# Patient Record
Sex: Female | Born: 1990
Health system: Southern US, Community
[De-identification: ages and names within clinical notes are randomized; demographics above are authoritative.]

## PROBLEM LIST (undated history)

## (undated) DIAGNOSIS — F419 Anxiety disorder, unspecified: Secondary | ICD-10-CM

## (undated) DIAGNOSIS — D649 Anemia, unspecified: Secondary | ICD-10-CM

## (undated) DIAGNOSIS — F32A Depression, unspecified: Secondary | ICD-10-CM

## (undated) DIAGNOSIS — N83209 Unspecified ovarian cyst, unspecified side: Secondary | ICD-10-CM

## (undated) DIAGNOSIS — N809 Endometriosis, unspecified: Secondary | ICD-10-CM

## (undated) DIAGNOSIS — F329 Major depressive disorder, single episode, unspecified: Secondary | ICD-10-CM

## (undated) HISTORY — DX: Depression, unspecified: F32.A

## (undated) HISTORY — DX: Anxiety disorder, unspecified: F41.9

## (undated) HISTORY — DX: Major depressive disorder, single episode, unspecified: F32.9

## (undated) HISTORY — PX: OTHER SURGICAL HISTORY: SHX169

## (undated) HISTORY — DX: Endometriosis, unspecified: N80.9

## (undated) HISTORY — DX: Anemia, unspecified: D64.9

---
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
## 2014-04-19 ENCOUNTER — Emergency Department: Payer: Self-pay | Admitting: Emergency Medicine

## 2014-04-19 LAB — URINALYSIS, COMPLETE
Bilirubin,UR: NEGATIVE
GLUCOSE, UR: NEGATIVE mg/dL (ref 0–75)
Ketone: NEGATIVE
Nitrite: NEGATIVE
PROTEIN: NEGATIVE
Ph: 5 (ref 4.5–8.0)
RBC,UR: 1 /HPF (ref 0–5)
SPECIFIC GRAVITY: 1.012 (ref 1.003–1.030)
Squamous Epithelial: 2
WBC UR: 3 /HPF (ref 0–5)

## 2014-04-19 LAB — CBC WITH DIFFERENTIAL/PLATELET
BASOS PCT: 0.6 %
Basophil #: 0.1 10*3/uL (ref 0.0–0.1)
EOS ABS: 0.1 10*3/uL (ref 0.0–0.7)
Eosinophil %: 0.5 %
HCT: 43.8 % (ref 35.0–47.0)
HGB: 14.4 g/dL (ref 12.0–16.0)
Lymphocyte #: 3.2 10*3/uL (ref 1.0–3.6)
Lymphocyte %: 27.1 %
MCH: 29.2 pg (ref 26.0–34.0)
MCHC: 32.9 g/dL (ref 32.0–36.0)
MCV: 89 fL (ref 80–100)
Monocyte #: 0.6 x10 3/mm (ref 0.2–0.9)
Monocyte %: 5.2 %
Neutrophil #: 7.8 10*3/uL — ABNORMAL HIGH (ref 1.4–6.5)
Neutrophil %: 66.6 %
Platelet: 315 10*3/uL (ref 150–440)
RBC: 4.93 10*6/uL (ref 3.80–5.20)
RDW: 13.1 % (ref 11.5–14.5)
WBC: 11.7 10*3/uL — AB (ref 3.6–11.0)

## 2014-04-19 LAB — COMPREHENSIVE METABOLIC PANEL
ALK PHOS: 107 U/L
ALT: 20 U/L (ref 12–78)
ANION GAP: 9 (ref 7–16)
Albumin: 4.3 g/dL (ref 3.4–5.0)
BILIRUBIN TOTAL: 0.5 mg/dL (ref 0.2–1.0)
BUN: 10 mg/dL (ref 7–18)
Calcium, Total: 9 mg/dL (ref 8.5–10.1)
Chloride: 105 mmol/L (ref 98–107)
Co2: 24 mmol/L (ref 21–32)
Creatinine: 0.89 mg/dL (ref 0.60–1.30)
EGFR (African American): >60
EGFR (Non-African Amer.): >60
GLUCOSE: 87 mg/dL (ref 65–99)
Osmolality: 274 (ref 275–301)
POTASSIUM: 3.8 mmol/L (ref 3.5–5.1)
SGOT(AST): 23 U/L (ref 15–37)
SODIUM: 138 mmol/L (ref 136–145)
TOTAL PROTEIN: 8.6 g/dL — AB (ref 6.4–8.2)

## 2014-04-19 LAB — LIPASE, BLOOD: LIPASE: 86 U/L (ref 73–393)

## 2014-04-30 ENCOUNTER — Emergency Department: Payer: Self-pay | Admitting: Emergency Medicine

## 2014-04-30 LAB — COMPREHENSIVE METABOLIC PANEL
ALK PHOS: 104 U/L
Albumin: 4.2 g/dL (ref 3.4–5.0)
Anion Gap: 5 — ABNORMAL LOW (ref 7–16)
BUN: 12 mg/dL (ref 7–18)
Bilirubin,Total: 0.7 mg/dL (ref 0.2–1.0)
CALCIUM: 9.3 mg/dL (ref 8.5–10.1)
CO2: 28 mmol/L (ref 21–32)
Chloride: 104 mmol/L (ref 98–107)
Creatinine: 1 mg/dL (ref 0.60–1.30)
Glucose: 95 mg/dL (ref 65–99)
Osmolality: 273 (ref 275–301)
Potassium: 3.6 mmol/L (ref 3.5–5.1)
SGOT(AST): 12 U/L — ABNORMAL LOW (ref 15–37)
SGPT (ALT): 14 U/L (ref 12–78)
SODIUM: 137 mmol/L (ref 136–145)
Total Protein: 8.5 g/dL — ABNORMAL HIGH (ref 6.4–8.2)

## 2014-04-30 LAB — CBC WITH DIFFERENTIAL/PLATELET
Basophil #: 0 10*3/uL (ref 0.0–0.1)
Basophil %: 0.6 %
EOS PCT: 0.2 %
Eosinophil #: 0 10*3/uL (ref 0.0–0.7)
HCT: 42 % (ref 35.0–47.0)
HGB: 13.8 g/dL (ref 12.0–16.0)
LYMPHS ABS: 1.4 10*3/uL (ref 1.0–3.6)
LYMPHS PCT: 21.2 %
MCH: 29.2 pg (ref 26.0–34.0)
MCHC: 32.9 g/dL (ref 32.0–36.0)
MCV: 89 fL (ref 80–100)
MONO ABS: 0.5 x10 3/mm (ref 0.2–0.9)
Monocyte %: 7.3 %
NEUTROS ABS: 4.6 10*3/uL (ref 1.4–6.5)
NEUTROS PCT: 70.7 %
PLATELETS: 318 10*3/uL (ref 150–440)
RBC: 4.73 10*6/uL (ref 3.80–5.20)
RDW: 12.9 % (ref 11.5–14.5)
WBC: 6.5 10*3/uL (ref 3.6–11.0)

## 2014-04-30 LAB — URINALYSIS, COMPLETE
Bilirubin,UR: NEGATIVE
GLUCOSE, UR: NEGATIVE mg/dL (ref 0–75)
Ketone: NEGATIVE
Leukocyte Esterase: NEGATIVE
NITRITE: NEGATIVE
PH: 6 (ref 4.5–8.0)
PROTEIN: NEGATIVE
Specific Gravity: 1.004 (ref 1.003–1.030)
Squamous Epithelial: 1
WBC UR: <1 /HPF (ref 0–5)

## 2014-12-11 ENCOUNTER — Emergency Department (HOSPITAL_COMMUNITY)
Admission: EM | Admit: 2014-12-11 | Discharge: 2014-12-12 | Disposition: A | Discharge status: Home / Self Care | Payer: BC Managed Care – PPO | Attending: Emergency Medicine | Admitting: Emergency Medicine

## 2014-12-11 ENCOUNTER — Emergency Department (HOSPITAL_COMMUNITY): Payer: BC Managed Care – PPO

## 2014-12-11 ENCOUNTER — Encounter (HOSPITAL_COMMUNITY): Payer: Self-pay | Admitting: Emergency Medicine

## 2014-12-11 DIAGNOSIS — R102 Pelvic and perineal pain: Secondary | ICD-10-CM | POA: Diagnosis not present

## 2014-12-11 DIAGNOSIS — Z3202 Encounter for pregnancy test, result negative: Secondary | ICD-10-CM | POA: Diagnosis not present

## 2014-12-11 DIAGNOSIS — Z8742 Personal history of other diseases of the female genital tract: Secondary | ICD-10-CM | POA: Insufficient documentation

## 2014-12-11 DIAGNOSIS — R1031 Right lower quadrant pain: Secondary | ICD-10-CM | POA: Diagnosis present

## 2014-12-11 HISTORY — DX: Unspecified ovarian cyst, unspecified side: N83.209

## 2014-12-11 LAB — CBC WITH DIFFERENTIAL/PLATELET
BASOS PCT: 0 % (ref 0–1)
Basophils Absolute: 0 10*3/uL (ref 0.0–0.1)
Eosinophils Absolute: 0 10*3/uL (ref 0.0–0.7)
Eosinophils Relative: 0 % (ref 0–5)
HEMATOCRIT: 39.2 % (ref 36.0–46.0)
Hemoglobin: 12.8 g/dL (ref 12.0–15.0)
Lymphocytes Relative: 23 % (ref 12–46)
Lymphs Abs: 2 10*3/uL (ref 0.7–4.0)
MCH: 28.3 pg (ref 26.0–34.0)
MCHC: 32.7 g/dL (ref 30.0–36.0)
MCV: 86.7 fL (ref 78.0–100.0)
MONO ABS: 0.6 10*3/uL (ref 0.1–1.0)
MONOS PCT: 7 % (ref 3–12)
Neutro Abs: 6.2 10*3/uL (ref 1.7–7.7)
Neutrophils Relative %: 70 % (ref 43–77)
Platelets: 370 10*3/uL (ref 150–400)
RBC: 4.52 MIL/uL (ref 3.87–5.11)
RDW: 12.8 % (ref 11.5–15.5)
WBC: 8.9 10*3/uL (ref 4.0–10.5)

## 2014-12-11 LAB — COMPREHENSIVE METABOLIC PANEL
ALBUMIN: 4.2 g/dL (ref 3.5–5.2)
ALK PHOS: 96 U/L (ref 39–117)
ALT: 10 U/L (ref 0–35)
AST: 14 U/L (ref 0–37)
Anion gap: 16 — ABNORMAL HIGH (ref 5–15)
BILIRUBIN TOTAL: 0.3 mg/dL (ref 0.3–1.2)
BUN: 8 mg/dL (ref 6–23)
CO2: 23 meq/L (ref 19–32)
CREATININE: 0.87 mg/dL (ref 0.50–1.10)
Calcium: 9.3 mg/dL (ref 8.4–10.5)
Chloride: 101 mEq/L (ref 96–112)
GFR calc non Af Amer: >90 mL/min (ref >90)
GLUCOSE: 94 mg/dL (ref 70–99)
POTASSIUM: 3.7 meq/L (ref 3.7–5.3)
Sodium: 140 mEq/L (ref 137–147)
Total Protein: 7.7 g/dL (ref 6.0–8.3)

## 2014-12-11 LAB — URINALYSIS, ROUTINE W REFLEX MICROSCOPIC
BILIRUBIN URINE: NEGATIVE
Glucose, UA: NEGATIVE mg/dL
HGB URINE DIPSTICK: NEGATIVE
KETONES UR: 15 mg/dL — AB
Leukocytes, UA: NEGATIVE
Nitrite: NEGATIVE
Protein, ur: NEGATIVE mg/dL
SPECIFIC GRAVITY, URINE: 1.019 (ref 1.005–1.030)
Urobilinogen, UA: 0.2 mg/dL (ref 0.0–1.0)
pH: 5.5 (ref 5.0–8.0)

## 2014-12-11 LAB — PREGNANCY, URINE: PREG TEST UR: NEGATIVE

## 2014-12-11 NOTE — ED Notes (Signed)
Patient travelling to Ultrasound with EMT Venus as chaperone.

## 2014-12-11 NOTE — ED Provider Notes (Signed)
CSN: [id number]     Arrival date & time 12/11/14  1950 History   First MD Initiated Contact with Patient 12/11/14 2109     Chief Complaint  Patient presents with  . Abdominal Pain     (Consider location/radiation/quality/duration/timing/severity/associated sxs/prior Treatment) HPI The patient reports she developed right lower quadrant pain yesterday evening. She was is painful at onset but has become somewhat more painful over the course of the day. It has been waxing and waning in severity. The patient reports she has not tried any ibuprofen because there is not much pain at certain times and then at other times it gets very painful. She reports it has not gotten any worse since about 4 PM. She reports it is a very local pain, she can put her finger on the one spot in her right lower quadrant. She reports she's had an ovarian cyst in the past however the same time she was having some "intestinal" problems. It was hard for her to differentiate which was causing the problem at that time. That was approximately 8 months ago. She reports she has some brown vaginal spotting about 2 weeks ago. She denies any abnormal vaginal discharge since. The patient is sexually active. There's been no fever. No vomiting or no diarrhea. She denies any pain burning or urgency to urination. Past Medical History  Diagnosis Date  . Ovarian cyst    History reviewed. No pertinent past surgical history. No family history on file. History  Substance Use Topics  . Smoking status: Never Smoker   . Smokeless tobacco: Not on file  . Alcohol Use: Yes   OB History    No data available     Review of Systems 10 Systems reviewed and are negative for acute change except as noted in the HPI.    Allergies  Review of patient's allergies indicates no known allergies.  Home Medications   Prior to Admission medications   Medication Sig Start Date End Date Taking? Authorizing Provider  ibuprofen (ADVIL,MOTRIN) 800 MG  tablet Take 1 tablet (800 mg total) by mouth 3 (three) times daily. 12/12/14   Arby BarretteMarcy Saheed Carrington, MD   BP 107/53 mmHg  Pulse 88  Temp(Src) 98 F (36.7 C) (Oral)  Resp 18  Ht 5\' 1"  (1.549 m)  Wt 165 lb (74.844 kg)  BMI 31.19 kg/m2  SpO2 99% Physical Exam  Constitutional: She is oriented to person, place, and time. She appears well-developed and well-nourished.  HENT:  Head: Normocephalic and atraumatic.  Eyes: EOM are normal. Pupils are equal, round, and reactive to light.  Neck: Neck supple.  Cardiovascular: Normal rate, regular rhythm, normal heart sounds and intact distal pulses.   Pulmonary/Chest: Effort normal and breath sounds normal.  Abdominal: Soft. Bowel sounds are normal. She exhibits no distension. There is no tenderness (The patient has focal right lower quadrant pain. Very local to the adnexa. No guarding or rebound present.).  Genitourinary: Vagina normal.  Speculum examination shows normal-appearing cervix without drainage or discharge. No friability. Bimanual examination no cervical motion tenderness. Pain localizes to the right adnexa. Left adnexa is nontender.  Musculoskeletal: Normal range of motion. She exhibits no edema.  Neurological: She is alert and oriented to person, place, and time. She has normal strength. Coordination normal. GCS eye subscore is 4. GCS verbal subscore is 5. GCS motor subscore is 6.  Skin: Skin is warm, dry and intact.  Psychiatric: She has a normal mood and affect.    ED Course  Procedures (including  critical care time) Labs Review Labs Reviewed  COMPREHENSIVE METABOLIC PANEL - Abnormal; Notable for the following:    Anion gap 16 (*)    All other components within normal limits  URINALYSIS, ROUTINE W REFLEX MICROSCOPIC - Abnormal; Notable for the following:    APPearance CLOUDY (*)    Ketones, ur 15 (*)    All other components within normal limits  WET PREP, GENITAL  GC/CHLAMYDIA PROBE AMP  CBC WITH DIFFERENTIAL  PREGNANCY, URINE     Imaging Review US Transvaginal Non-ob  12/11/2014   CLINICAL DATA:  Acute onset of pelvic pain.  Initial encounter.  EXAM: TRANSABDOMINAL AND TRANSVAGINAL ULTRASOUND OF PELVIS  DOPPLER ULTRASOUND OF OVARIES  TECHNIQUE: Both transabdominal and transvaginal ultrasound examinations of the pelvis were performed. Transabdominal technique was performed for global imaging of the pelvis including uterus, ovaries, adnexal regions, and pelvic cul-de-sac.  It was necessary to proceed with endovaginal exam following the transabdominal exam to visualize the uterus and ovaries in greater detail. Color and duplex Doppler ultrasound was utilized to evaluate blood flow to the ovaries.  COMPARISON:  None.  FINDINGS: Uterus  Measurements: 8.2 x 3.1 x 4.1 cm. No fibroids or other mass visualized. A nabothian cyst is noted at the cervix.  Endometrium  Thickness: 0.3 cm.  No focal abnormality visualized.  Right ovary  Measurements: 3.9 x 2.0 x 1.9 cm. Normal appearance/no adnexal mass.  Left ovary  Measurements: 3.3 x 1.8 x 2.4 cm. Normal appearance/no adnexal mass.  Pulsed Doppler evaluation of both ovaries demonstrates normal low-resistance arterial and venous waveforms.  Other findings  A small amount of free fluid is seen in the pelvic cul-de-sac.  IMPRESSION: Unremarkable pelvic ultrasound.  No evidence for ovarian torsion.   Electronically Signed   By: Roanna Raider M.D.   On: 12/11/2014 23:58   US Pelvis Complete  12/11/2014   CLINICAL DATA:  Acute onset of pelvic pain.  Initial encounter.  EXAM: TRANSABDOMINAL AND TRANSVAGINAL ULTRASOUND OF PELVIS  DOPPLER ULTRASOUND OF OVARIES  TECHNIQUE: Both transabdominal and transvaginal ultrasound examinations of the pelvis were performed. Transabdominal technique was performed for global imaging of the pelvis including uterus, ovaries, adnexal regions, and pelvic cul-de-sac.  It was necessary to proceed with endovaginal exam following the transabdominal exam to visualize the  uterus and ovaries in greater detail. Color and duplex Doppler ultrasound was utilized to evaluate blood flow to the ovaries.  COMPARISON:  None.  FINDINGS: Uterus  Measurements: 8.2 x 3.1 x 4.1 cm. No fibroids or other mass visualized. A nabothian cyst is noted at the cervix.  Endometrium  Thickness: 0.3 cm.  No focal abnormality visualized.  Right ovary  Measurements: 3.9 x 2.0 x 1.9 cm. Normal appearance/no adnexal mass.  Left ovary  Measurements: 3.3 x 1.8 x 2.4 cm. Normal appearance/no adnexal mass.  Pulsed Doppler evaluation of both ovaries demonstrates normal low-resistance arterial and venous waveforms.  Other findings  A small amount of free fluid is seen in the pelvic cul-de-sac.  IMPRESSION: Unremarkable pelvic ultrasound.  No evidence for ovarian torsion.   Electronically Signed   By: Roanna Raider M.D.   On: 12/11/2014 23:58   [place] Art/ven Flow Abd Pelv Doppler  12/11/2014   CLINICAL DATA:  Acute onset of pelvic pain.  Initial encounter.  EXAM: TRANSABDOMINAL AND TRANSVAGINAL ULTRASOUND OF PELVIS  DOPPLER ULTRASOUND OF OVARIES  TECHNIQUE: Both transabdominal and transvaginal ultrasound examinations of the pelvis were performed. Transabdominal technique was performed for global imaging of the pelvis  including uterus, ovaries, adnexal regions, and pelvic cul-de-sac.  It was necessary to proceed with endovaginal exam following the transabdominal exam to visualize the uterus and ovaries in greater detail. Color and duplex Doppler ultrasound was utilized to evaluate blood flow to the ovaries.  COMPARISON:  None.  FINDINGS: Uterus  Measurements: 8.2 x 3.1 x 4.1 cm. No fibroids or other mass visualized. A nabothian cyst is noted at the cervix.  Endometrium  Thickness: 0.3 cm.  No focal abnormality visualized.  Right ovary  Measurements: 3.9 x 2.0 x 1.9 cm. Normal appearance/no adnexal mass.  Left ovary  Measurements: 3.3 x 1.8 x 2.4 cm. Normal appearance/no adnexal mass.  Pulsed Doppler evaluation of both  ovaries demonstrates normal low-resistance arterial and venous waveforms.  Other findings  A small amount of free fluid is seen in the pelvic cul-de-sac.  IMPRESSION: Unremarkable pelvic ultrasound.  No evidence for ovarian torsion.   Electronically Signed   By: Roanna RaiderJeffery  Chang M.D.   On: 12/11/2014 23:58     EKG Interpretation None      MDM   Final diagnoses:  Pelvic pain in female   Torsion is ruled out. The patient's pain on pelvic examination localizes to the adnexa. There is no acute evidence of cervicitis or PID. This appears to be of lower probability to be appendicitis. The pain has been waxing and waning in severity and has not been worsening over the past 6 hours. The patient does not have leukocytosis or guarding. At this time she is given precautionary instructions for abdominal pain.    Arby BarretteMarcy Kimberlin Scheel, MD 12/12/14 [phone]

## 2014-12-11 NOTE — ED Notes (Signed)
Pt. reports right abdominal pain with nausea and vomitting onset yesterday ,no diarrhea , denies fever or chills.

## 2014-12-12 LAB — WET PREP, GENITAL
Clue Cells Wet Prep HPF POC: NONE SEEN
TRICH WET PREP: NONE SEEN
Yeast Wet Prep HPF POC: NONE SEEN

## 2014-12-12 MED ORDER — IBUPROFEN 800 MG PO TABS
800.0000 mg | ORAL_TABLET | Freq: Once | ORAL | Status: AC
  Administered 2014-12-12: 800 mg via ORAL
  Filled 2014-12-12: qty 1

## 2014-12-12 MED ORDER — IBUPROFEN 800 MG PO TABS: 800.0000 mg | ORAL_TABLET | Freq: Three times a day (TID) | ORAL | Status: DC

## 2014-12-12 NOTE — Discharge Instructions (Signed)
Abdominal Pain, Women °Abdominal (stomach, pelvic, or belly) pain can be caused by many things. It is important to tell your doctor: °· The location of the pain. °· Does it come and go or is it present all the time? °· Are there things that start the pain (eating certain foods, exercise)? °· Are there other symptoms associated with the pain (fever, nausea, vomiting, diarrhea)? °All of this is helpful to know when trying to find the cause of the pain. °CAUSES  °· Stomach: virus or bacteria infection, or ulcer. °· Intestine: appendicitis (inflamed appendix), regional ileitis (Crohn's disease), ulcerative colitis (inflamed colon), irritable bowel syndrome, diverticulitis (inflamed diverticulum of the colon), or cancer of the stomach or intestine. °· Gallbladder disease or stones in the gallbladder. °· Kidney disease, kidney stones, or infection. °· Pancreas infection or cancer. °· Fibromyalgia (pain disorder). °· Diseases of the female organs: °¨ Uterus: fibroid (non-cancerous) tumors or infection. °¨ Fallopian tubes: infection or tubal pregnancy. °¨ Ovary: cysts or tumors. °¨ Pelvic adhesions (scar tissue). °¨ Endometriosis (uterus lining tissue growing in the pelvis and on the pelvic organs). °¨ Pelvic congestion syndrome (female organs filling up with blood just before the menstrual period). °¨ Pain with the menstrual period. °¨ Pain with ovulation (producing an egg). °¨ Pain with an IUD (intrauterine device, birth control) in the uterus. °¨ Cancer of the female organs. °· Functional pain (pain not caused by a disease, may improve without treatment). °· Psychological pain. °· Depression. °DIAGNOSIS  °Your doctor will decide the seriousness of your pain by doing an examination. °· Blood tests. °· X-rays. °· Ultrasound. °· CT scan (computed tomography, special type of X-ray). °· MRI (magnetic resonance imaging). °· Cultures, for infection. °· Barium enema (dye inserted in the large intestine, to better view it with  X-rays). °· Colonoscopy (looking in intestine with a lighted tube). °· Laparoscopy (minor surgery, looking in abdomen with a lighted tube). °· Major abdominal exploratory surgery (looking in abdomen with a large incision). °TREATMENT  °The treatment will depend on the cause of the pain.  °· Many cases can be observed and treated at home. °· Over-the-counter medicines recommended by your caregiver. °· Prescription medicine. °· Antibiotics, for infection. °· Birth control pills, for painful periods or for ovulation pain. °· Hormone treatment, for endometriosis. °· Nerve blocking injections. °· Physical therapy. °· Antidepressants. °· Counseling with a psychologist or psychiatrist. °· Minor or major surgery. °HOME CARE INSTRUCTIONS  °· Do not take laxatives, unless directed by your caregiver. °· Take over-the-counter pain medicine only if ordered by your caregiver. Do not take aspirin because it can cause an upset stomach or bleeding. °· Try a clear liquid diet (broth or water) as ordered by your caregiver. Slowly move to a bland diet, as tolerated, if the pain is related to the stomach or intestine. °· Have a thermometer and take your temperature several times a day, and record it. °· Bed rest and sleep, if it helps the pain. °· Avoid sexual intercourse, if it causes pain. °· Avoid stressful situations. °· Keep your follow-up appointments and tests, as your caregiver orders. °· If the pain does not go away with medicine or surgery, you may try: °¨ Acupuncture. °¨ Relaxation exercises (yoga, meditation). °¨ Group therapy. °¨ Counseling. °SEEK MEDICAL CARE IF:  °· You notice certain foods cause stomach pain. °· Your home care treatment is not helping your pain. °· You need stronger pain medicine. °· You want your IUD removed. °· You feel faint or   lightheaded. °· You develop nausea and vomiting. °· You develop a rash. °· You are having side effects or an allergy to your medicine. °SEEK IMMEDIATE MEDICAL CARE IF:  °· Your  pain does not go away or gets worse. °· You have a fever. °· Your pain is felt only in portions of the abdomen. The right side could possibly be appendicitis. The left lower portion of the abdomen could be colitis or diverticulitis. °· You are passing blood in your stools (bright red or black tarry stools, with or without vomiting). °· You have blood in your urine. °· You develop chills, with or without a fever. °· You pass out. °MAKE SURE YOU:  °· Understand these instructions. °· Will watch your condition. °· Will get help right away if you are not doing well or get worse. °Document Released: 10/08/2007 Document Revised: 04/27/2014 Document Reviewed: 10/28/2009 °[employer]® Patient Information ©2015 [employer]. This information is not intended to replace advice given to you by your health care provider. Make sure you discuss any questions you have with your health care provider. ° °

## 2014-12-14 LAB — GC/CHLAMYDIA PROBE AMP
CT PROBE, AMP APTIMA: NEGATIVE
GC Probe RNA: NEGATIVE

## 2015-01-11 ENCOUNTER — Ambulatory Visit: Payer: Self-pay | Admitting: Obstetrics & Gynecology

## 2015-01-11 LAB — CBC
HCT: 41.9 % (ref 35.0–47.0)
HGB: 13.2 g/dL (ref 12.0–16.0)
MCH: 28.5 pg (ref 26.0–34.0)
MCHC: 31.4 g/dL — AB (ref 32.0–36.0)
MCV: 91 fL (ref 80–100)
Platelet: 324 10*3/uL (ref 150–440)
RBC: 4.61 10*6/uL (ref 3.80–5.20)
RDW: 13.9 % (ref 11.5–14.5)
WBC: 8.2 10*3/uL (ref 3.6–11.0)

## 2015-01-19 ENCOUNTER — Ambulatory Visit: Payer: Self-pay | Admitting: Obstetrics & Gynecology

## 2015-04-19 LAB — SURGICAL PATHOLOGY

## 2015-04-25 NOTE — Op Note (Signed)
PATIENT NAME:  Tonya Hill, Tonya Hill MR#:  [id number] DATE OF BIRTH:  01-04-1991  DATE OF PROCEDURE:  01/19/2015  PREOPERATIVE DIAGNOSIS: Chronic pelvic pain.   POSTOPERATIVE DIAGNOSIS: Chronic pelvic pain.   PROCEDURE: Diagnostic laparoscopy, biopsy of peritoneum.   SURGEON: Dierdre Searles. Paul Leslea Vowles, MD   ANESTHESIA: General.   ESTIMATED BLOOD LOSS: Minimal.   COMPLICATIONS: None.   FINDINGS: Normal appendix, liver, bowel, colon, ovaries, tubes, and uterus. There is a focus of possible endometriosis in the posterior cul-de-sac on the left side.   DISPOSITION: To the recovery room in stable condition.   TECHNIQUE: The patient is prepped and draped in the usual sterile fashion after adequate anesthesia is obtained in the dorsal lithotomy position. The bladder is drained with a Robinson catheter. A speculum is placed, the cervix is identified, and a Hulka tenaculum is placed.   Attention is then turned to the abdomen, where a Veress needle is inserted through a 5 mm infraumbilical incision after Marcaine is used to anesthetize the skin. Veress needle placement is confirmed using the hanging drop technique and the abdomen is then insufflated with CO2 gas. A 5 mm trocar is then inserted under direct visualization with the laparoscope, with no injuries or bleeding noted. The patient is placed in Trendelenburg positioning and the above-mentioned findings were visualized.   A 5 mm trocar is placed in the right lower quadrant, lateral to the inferior epigastric blood vessels, with no injuries or bleeding noted. This allowed further instrumentation for a complete visualization of the abdominal and pelvic organs. The peritoneum posterior to the left uterosacral ligament is biopsied. The tissue was minimal, and there is no bleeding at the biopsy site. There is some free fluid in the pelvic cul-de-sac, and this is aspirated. The patient is leveled. No bleeding is noted. No complications were visualized. Gas is  expelled. Trocars are removed, and skin is closed with Dermabond. The patient goes to the recovery room in stable condition after the Hulka tenaculum was removed. All sponge, instrument, and needle counts were correct.     ____________________________ R. Annamarie MajorPaul Marlin Brys, MD rph:mw D: 01/19/2015 13:16:14 ET T: 01/19/2015 14:43:21 ET JOB#: [id number]  cc: Dierdre Searles. Paul Zyrus Hetland, MD, <Dictator> Nadara MustardOBERT P Lorilyn Laitinen MD ELECTRONICALLY SIGNED 01/19/2015 15:56

## 2015-08-05 ENCOUNTER — Ambulatory Visit: Payer: BLUE CROSS/BLUE SHIELD | Admitting: Podiatry

## 2015-09-08 ENCOUNTER — Encounter: Payer: Self-pay | Admitting: Obstetrics and Gynecology

## 2015-09-08 ENCOUNTER — Ambulatory Visit (INDEPENDENT_AMBULATORY_CARE_PROVIDER_SITE_OTHER): Payer: BLUE CROSS/BLUE SHIELD | Admitting: Obstetrics and Gynecology

## 2015-09-08 VITALS — BP 122/70 | HR 84 | Resp 14 | Ht 61.5 in | Wt 179.0 lb

## 2015-09-08 DIAGNOSIS — Z124 Encounter for screening for malignant neoplasm of cervix: Secondary | ICD-10-CM

## 2015-09-08 DIAGNOSIS — Z01419 Encounter for gynecological examination (general) (routine) without abnormal findings: Secondary | ICD-10-CM

## 2015-09-08 DIAGNOSIS — Z3009 Encounter for other general counseling and advice on contraception: Secondary | ICD-10-CM

## 2015-09-08 DIAGNOSIS — N946 Dysmenorrhea, unspecified: Secondary | ICD-10-CM | POA: Diagnosis not present

## 2015-09-08 DIAGNOSIS — Z113 Encounter for screening for infections with a predominantly sexual mode of transmission: Secondary | ICD-10-CM

## 2015-09-08 MED ORDER — NORETHIN ACE-ETH ESTRAD-FE 1-20 MG-MCG PO TABS: 1.0000 | ORAL_TABLET | Freq: Every day | ORAL | Status: DC

## 2015-09-08 MED ORDER — NAPROXEN SODIUM 550 MG PO TABS: 550.0000 mg | ORAL_TABLET | Freq: Two times a day (BID) | ORAL | Status: DC

## 2015-09-08 NOTE — Progress Notes (Signed)
Patient ID: Tonya Hill, female   DOB: 1991/12/20, 24 y.o.   MRN: [id number] 24 y.o. G1P0100 SingleCaucasianF here for annual exam.  The patient has a nexplanon for almost 2 years. She went on it for cycle control, initially she didn't bleed. Since January she has monthly heavy, crampy cycles. Prior to the nexplanon her cycles were q 3-4 weeks for 1-2 weeks. Currently she saturates a super tampon in up to 2 hours. Cramps are bad, but still better than prior to the nexplanon. She has gained 30 lbs on the Nexplanon. Sexually active, same partner x 2 years. No dyspareunia.   When she was on ON777 she developed migraines, no auras, no migraines since going off of OCP's.   Patient wants to discuss having her Nexplanon removed  Period Cycle (Days): 28 Period Duration (Days): 5 days  Period Pattern: Regular Menstrual Flow: Heavy Menstrual Control: Tampon Dysmenorrhea: (!) Moderate Dysmenorrhea Symptoms: Cramping  Patient's last menstrual period was 08/21/2015.          Sexually active: Yes.    The current method of family planning is Nexplanon    Exercising: Yes.    running/ cardio Smoker:  no  Health Maintenance: Pap:  Never History of abnormal Pap:  N/A MMG:  N/A Colonoscopy:  N/A BMD:   N/A TDaP:  2012 Gardasil: completed all 3 per patient    reports that she has never smoked. She has never used smokeless tobacco. She reports that she drinks about 3.0 oz of alcohol per week. She reports that she does not use illicit drugs.  Past Medical History  Diagnosis Date  . Ovarian cyst   . Anemia   . Anxiety   . Depression   . Endometriosis   She is seeing a therapist. Has an appointment to discuss medication with her primary.   History reviewed. No pertinent past surgical history.  Current Outpatient Prescriptions  Medication Sig Dispense Refill  . etonogestrel (NEXPLANON) 68 MG IMPL implant 1 each by Subdermal route once.     No current facility-administered medications for this  visit.    Family History  Problem Relation Age of Onset  . Hypertension Sister   . Diabetes Paternal Grandfather   . Hypertension Paternal Grandfather     Review of Systems  Constitutional: Negative.   HENT: Negative.   Eyes: Negative.   Respiratory: Negative.   Cardiovascular: Negative.   Gastrointestinal: Negative.   Endocrine: Negative.   Genitourinary: Negative.   Musculoskeletal: Negative.   Skin: Negative.   Allergic/Immunologic: Negative.   Neurological: Negative.   Psychiatric/Behavioral: Negative.     Exam:   BP 122/70 mmHg  Pulse 84  Resp 14  Ht 5' 1.5" (1.562 m)  Wt 179 lb (81.194 kg)  BMI 33.28 kg/m2  LMP 08/21/2015  Weight change: @WEIGHTCHANGE @ Height:   Height: 5' 1.5" (156.2 cm)  Ht Readings from Last 3 Encounters:  09/08/15 5' 1.5" (1.562 m)  12/11/14 5\' 1"  (1.549 m)    General appearance: alert, cooperative and appears stated age Head: Normocephalic, without obvious abnormality, atraumatic Neck: no adenopathy, supple, symmetrical, trachea midline and thyroid normal to inspection and palpation Lungs: clear to auscultation bilaterally Breasts: normal appearance, no masses or tenderness Heart: regular rate and rhythm Abdomen: soft, non-tender; bowel sounds normal; no masses,  no organomegaly Extremities: extremities normal, atraumatic, no cyanosis or edema Skin: Skin color, texture, turgor normal. No rashes or lesions Lymph nodes: Cervical, supraclavicular, and axillary nodes normal. No abnormal inguinal nodes palpated Neurologic: Grossly  normal   Pelvic: External genitalia:  no lesions              Urethra:  normal appearing urethra with no masses, tenderness or lesions              Bartholins and Skenes: normal                 Vagina: normal appearing vagina with normal color and discharge, no lesions              Cervix: no lesions               Bimanual Exam:  Uterus:  normal size, contour, position, consistency, mobility, non-tender and  anteverted              Adnexa: no mass, fullness, tenderness               Rectovaginal: Confirms               Anus:  normal sphincter tone, no lesions  Chaperone was present for exam.  A:  Well Woman with normal exam  Contraception management, not happy with the Nexplanon. H/o heavy painful cycles  Dysmenorrhea  Screening for STD   P:   Return for Nexplanon removal  Reviewed other options for contraception. Interested in OCP's, no contraindications, risks reviewed. If her migraines return she may need to stop the pills  Anaprox for cramps  Pap  STD testing, declines blood work  She will schedule the nexplanon removal when she start the pill on the first day of her cycle  Will then have her return for a 3 month pill check

## 2015-09-08 NOTE — Patient Instructions (Signed)
Oral Contraception Information Oral contraceptive pills (OCPs) are medicines taken to prevent pregnancy. OCPs work by preventing the ovaries from releasing eggs. The hormones in OCPs also cause the cervical mucus to thicken, preventing the sperm from entering the uterus. The hormones also cause the uterine lining to become thin, not allowing a fertilized egg to attach to the inside of the uterus. OCPs are highly effective when taken exactly as prescribed. However, OCPs do not prevent sexually transmitted diseases (STDs). Safe sex practices, such as using condoms along with the pill, can help prevent STDs.  Before taking the pill, you may have a physical exam and Pap test. Your health care provider may order blood tests. The health care provider will make sure you are a good candidate for oral contraception. Discuss with your health care provider the possible side effects of the OCP you may be prescribed. When starting an OCP, it can take 2 to 3 months for the body to adjust to the changes in hormone levels in your body.  TYPES OF ORAL CONTRACEPTION  The combination pill--This pill contains estrogen and progestin (synthetic progesterone) hormones. The combination pill comes in 21-day, 28-day, or 91-day packs. Some types of combination pills are meant to be taken continuously (365-day pills). With 21-day packs, you do not take pills for 7 days after the last pill. With 28-day packs, the pill is taken every day. The last 7 pills are without hormones. Certain types of pills have more than 21 hormone-containing pills. With 91-day packs, the first 84 pills contain both hormones, and the last 7 pills contain no hormones or contain estrogen only.  The minipill--This pill contains the progesterone hormone only. The pill is taken every day continuously. It is very important to take the pill at the same time each day. The minipill comes in packs of 28 pills. All 28 pills contain the hormone.  ADVANTAGES OF ORAL  CONTRACEPTIVE PILLS  Decreases premenstrual symptoms.   Treats menstrual period cramps.   Regulates the menstrual cycle.   Decreases a heavy menstrual flow.   May treatacne, depending on the type of pill.   Treats abnormal uterine bleeding.   Treats polycystic ovarian syndrome.   Treats endometriosis.   Can be used as emergency contraception.  THINGS THAT CAN MAKE ORAL CONTRACEPTIVE PILLS LESS EFFECTIVE OCPs can be less effective if:   You forget to take the pill at the same time every day.   You have a stomach or intestinal disease that lessens the absorption of the pill.   You take OCPs with other medicines that make OCPs less effective, such as antibiotics, certain HIV medicines, and some seizure medicines.   You take expired OCPs.   You forget to restart the pill on day 7, when using the packs of 21 pills.  RISKS ASSOCIATED WITH ORAL CONTRACEPTIVE PILLS  Oral contraceptive pills can sometimes cause side effects, such as:  Headache.  Nausea.  Breast tenderness.  Irregular bleeding or spotting. Combination pills are also associated with a small increased risk of:  Blood clots.  Heart attack.  Stroke. Document Released: 03/03/2003 Document Revised: 10/01/2013 Document Reviewed: 06/01/2013 [employer] Patient Information 2015 [employer]. This information is not intended to replace advice given to you by your health care provider. Make sure you discuss any questions you have with your health care provider.   EXERCISE AND DIET:  We recommended that you start or continue a regular exercise program for good health. Regular exercise means any activity that makes your   heart beat faster and makes you sweat.  We recommend exercising at least 30 minutes per day at least 3 days a week, preferably 4 or 5.  We also recommend a diet low in fat and sugar.  Inactivity, poor dietary choices and obesity can cause diabetes, heart attack, stroke, and kidney damage,  among others.    ALCOHOL AND SMOKING:  Women should limit their alcohol intake to no more than 7 drinks/beers/glasses of wine (combined, not each!) per week. Moderation of alcohol intake to this level decreases your risk of breast cancer and liver damage. And of course, no recreational drugs are part of a healthy lifestyle.  And absolutely no smoking or even second hand smoke. Most people know smoking can cause heart and lung diseases, but did you know it also contributes to weakening of your bones? Aging of your skin?  Yellowing of your teeth and nails?  CALCIUM AND VITAMIN D:  Adequate intake of calcium and Vitamin D are recommended.  The recommendations for exact amounts of these supplements seem to change often, but generally speaking 600 mg of calcium (either carbonate or citrate) and 800 units of Vitamin D per day seems prudent. Certain women may benefit from higher intake of Vitamin D.  If you are among these women, your doctor will have told you during your visit.    PAP SMEARS:  Pap smears, to check for cervical cancer or precancers,  have traditionally been done yearly, although recent scientific advances have shown that most women can have pap smears less often.  However, every woman still should have a physical exam from her gynecologist every year. It will include a breast check, inspection of the vulva and vagina to check for abnormal growths or skin changes, a visual exam of the cervix, and then an exam to evaluate the size and shape of the uterus and ovaries.  And after 22 years of age, a rectal exam is indicated to check for rectal cancers. We will also provide age appropriate advice regarding health maintenance, like when you should have certain vaccines, screening for sexually transmitted diseases, bone density testing, colonoscopy, mammograms, etc.   MAMMOGRAMS:  All women over 40 years old should have a yearly mammogram. Many facilities now offer a "3D" mammogram, which may cost around  $50 extra out of pocket. If possible,  we recommend you accept the option to have the 3D mammogram performed.  It both reduces the number of women who will be called back for extra views which then turn out to be normal, and it is better than the routine mammogram at detecting truly abnormal areas.    COLONOSCOPY:  Colonoscopy to screen for colon cancer is recommended for all women at age 50.  We know, you hate the idea of the prep.  We agree, BUT, having colon cancer and not knowing it is worse!!  Colon cancer so often starts as a polyp that can be seen and removed at colonscopy, which can quite literally save your life!  And if your first colonoscopy is normal and you have no family history of colon cancer, most women don't have to have it again for 10 years.  Once every ten years, you can do something that may end up saving your life, right?  We will be happy to help you get it scheduled when you are ready.  Be sure to check your insurance coverage so you understand how much it will cost.  It may be covered as a preventative service   at no cost, but you should check your particular policy.      

## 2015-09-10 LAB — IPS PAP SMEAR ONLY

## 2015-09-10 LAB — IPS N GONORRHOEA AND CHLAMYDIA BY PCR

## 2015-09-14 ENCOUNTER — Telehealth: Payer: Self-pay

## 2015-09-14 NOTE — Telephone Encounter (Signed)
Spoke with patient. Advised of results as seen below from Dr.Jertson. Patient is agreeable. Denies any current symptoms. Will return call if she develops any symptoms. 02 recall in.   Routing to provider for final review. Patient agreeable to disposition. Will close encounter.

## 2015-09-14 NOTE — Telephone Encounter (Signed)
-----   Message from Romualdo Bolk, MD sent at 09/14/2015 10:53 AM EDT ----- Please inform the patient that her pap was negative, but showed signs of BV. This only needs to be treated if she is symptomatic. If she desires treatment, please call in flagyl 500 mg BID x 7 days (no ETOH). Genprobe was negative. Thanks. Pap 02

## 2015-09-17 DIAGNOSIS — E669 Obesity, unspecified: Secondary | ICD-10-CM | POA: Insufficient documentation

## 2015-09-17 DIAGNOSIS — F329 Major depressive disorder, single episode, unspecified: Secondary | ICD-10-CM | POA: Insufficient documentation

## 2015-09-17 DIAGNOSIS — F419 Anxiety disorder, unspecified: Secondary | ICD-10-CM | POA: Insufficient documentation

## 2015-09-21 ENCOUNTER — Telehealth: Payer: Self-pay | Admitting: Obstetrics and Gynecology

## 2015-09-21 NOTE — Telephone Encounter (Signed)
Spoke with patient. Patient would like to scheduled her IUD removal. Appointment scheduled for 09/27/2015 at 4 pm with Dr.Jerstson due to patient's class schedule. Agreeable to date and time.  Routing to provider for final review. Patient agreeable to disposition. Will close encounter.

## 2015-09-21 NOTE — Telephone Encounter (Signed)
Patient has started her cycle and is ready to have Nexplanon inserted. Last seen 09/08/15.

## 2015-09-27 ENCOUNTER — Ambulatory Visit (INDEPENDENT_AMBULATORY_CARE_PROVIDER_SITE_OTHER): Payer: BLUE CROSS/BLUE SHIELD | Admitting: Obstetrics and Gynecology

## 2015-09-27 VITALS — BP 136/60 | HR 72 | Resp 16 | Ht 61.5 in | Wt 177.0 lb

## 2015-09-27 DIAGNOSIS — Z308 Encounter for other contraceptive management: Secondary | ICD-10-CM

## 2015-09-27 DIAGNOSIS — Z3046 Encounter for surveillance of implantable subdermal contraceptive: Secondary | ICD-10-CM

## 2015-09-27 NOTE — Progress Notes (Signed)
GYNECOLOGY  VISIT   HPI: 24 y.o.   Single  Caucasian  female   G1P0010 with Patient's last menstrual period was 09/24/2015.   here for Nexplanon removal. She started OCP's on the first day of her cycle on 09/24/15.  GYNECOLOGIC HISTORY: Patient's last menstrual period was 09/24/2015. Contraception: Nexplanon Menopausal hormone therapy: None        OB History    Gravida Para Term Preterm AB TAB SAB Ectopic Multiple Living   1 0  0 1  1            There are no active problems to display for this patient.   Past Medical History  Diagnosis Date  . Ovarian cyst   . Anemia   . Anxiety   . Depression   . Endometriosis     No past surgical history on file.  Current Outpatient Prescriptions  Medication Sig Dispense Refill  . escitalopram (LEXAPRO) 10 MG tablet Take 1 tablet by mouth daily.    Marland Kitchen etonogestrel (NEXPLANON) 68 MG IMPL implant 1 each by Subdermal route once.    . naproxen sodium (ANAPROX DS) 550 MG tablet Take 1 tablet (550 mg total) by mouth 2 (two) times daily with a meal. 30 tablet 2  . norethindrone-ethinyl estradiol (JUNEL FE,GILDESS FE,LOESTRIN FE) 1-20 MG-MCG tablet Take 1 tablet by mouth daily. 3 Package 0   No current facility-administered medications for this visit.     ALLERGIES: Review of patient's allergies indicates no known allergies.  Family History  Problem Relation Age of Onset  . Hypertension Sister   . Diabetes Paternal Grandfather   . Hypertension Paternal Grandfather     Social History   Social History  . Marital Status: Single    Spouse Name: N/A  . Number of Children: N/A  . Years of Education: N/A   Occupational History  . Not on file.   Social History Main Topics  . Smoking status: Never Smoker   . Smokeless tobacco: Never Used  . Alcohol Use: 3.0 oz/week    5 Standard drinks or equivalent per week  . Drug Use: No  . Sexual Activity:    Partners: Male   Other Topics Concern  . Not on file   Social History Narrative     ROS  PHYSICAL EXAMINATION:    BP 136/60 mmHg  Pulse 72  Resp 16  Ht 5' 1.5" (1.562 m)  Wt 177 lb (80.287 kg)  BMI 32.91 kg/m2  LMP 09/24/2015    General appearance: alert, cooperative and appears stated age   Risks of nexplanon removal  were reviewed with the patient and a consent was signed.  The patient was placed in the supine position with her left arm bent at the elbow. The area was cleansed with betadine and injected with 1% lidocaine. The patient was very worried about pain and needed extra lidocaine, approximately 5 cc of lidocaine was injected. A #11 blade was used to incise over the distal end of the nexplanon implant. Given the extra local and deep proximal portion of the nexplanon, it took several attempts to grasp the distal end of the implant. The implant was grasped with a clamp and removed.   The patients arm was cleansed of betadine and a steri strip was placed over the incision. A gauze was wrapped around her arm.  She tolerated the procedure well  Instructions for care were discussed.        ASSESSMENT Nexplanon removal Contraception    PLAN  Nexplanon removed. On OCP's Condoms encouraged   An After Visit Summary was printed and given to the patient.

## 2015-12-23 ENCOUNTER — Other Ambulatory Visit: Payer: Self-pay | Admitting: Obstetrics and Gynecology

## 2015-12-23 MED ORDER — NORETHIN ACE-ETH ESTRAD-FE 1-20 MG-MCG PO TABS: 1.0000 | ORAL_TABLET | Freq: Every day | ORAL | Status: DC

## 2015-12-23 NOTE — Telephone Encounter (Signed)
Medication refill request: OCP Last AEX:  09-08-15 Next AEX: not yet scheduled  Last MMG (if hormonal medication request): N/A Refill authorized: please advise

## 2015-12-23 NOTE — Telephone Encounter (Signed)
Please have her schedule a pill check, 1 pack called in.

## 2015-12-23 NOTE — Telephone Encounter (Signed)
I called patient and set her up for next week to follow up on her OCP-eh

## 2015-12-23 NOTE — Telephone Encounter (Signed)
Patient requesting refill of Gildess FE 1/20 to [facility]. Best # to reach: [phone]

## 2015-12-26 HISTORY — PX: INTRAUTERINE DEVICE INSERTION: SHX323

## 2015-12-30 ENCOUNTER — Ambulatory Visit: Payer: Self-pay | Admitting: Obstetrics and Gynecology

## 2016-01-12 ENCOUNTER — Ambulatory Visit (INDEPENDENT_AMBULATORY_CARE_PROVIDER_SITE_OTHER): Payer: 59 | Admitting: Obstetrics and Gynecology

## 2016-01-12 ENCOUNTER — Encounter: Payer: Self-pay | Admitting: Obstetrics and Gynecology

## 2016-01-12 VITALS — BP 118/80 | HR 76 | Resp 16 | Wt 190.0 lb

## 2016-01-12 DIAGNOSIS — Z3009 Encounter for other general counseling and advice on contraception: Secondary | ICD-10-CM | POA: Diagnosis not present

## 2016-01-12 MED ORDER — MISOPROSTOL 200 MCG PO TABS: ORAL_TABLET | ORAL | Status: DC

## 2016-01-12 NOTE — Progress Notes (Signed)
Patient ID: [facility], female   DOB: 1991-01-09, 25 y.o.   MRN: [id number] GYNECOLOGY  VISIT   HPI: 25 y.o.   Single  Caucasian  female   G1P0010 with Patient's last menstrual period was 01/05/2016.   here to follow up on OCP. She would like to discuss getting the Mirena IUD.    She had a nexplanon removed in the fall, gained 20 lbs on the nexplanon. She is forgetting to take her pills. Not currently sexually active, broke up with her boyfriend. Cycles on the pill last x 3 days, saturating a super tampon in over 3 hours. Cramps tolerable on the pill. H/O heavy, crampy cycles.   GYNECOLOGIC HISTORY: Patient's last menstrual period was 01/05/2016. Contraception:OCP Menopausal hormone therapy: None        OB History    Gravida Para Term Preterm AB TAB SAB Ectopic Multiple Living   1 0  0 1  1            There are no active problems to display for this patient.   Past Medical History  Diagnosis Date  . Ovarian cyst   . Anemia   . Anxiety   . Depression   . Endometriosis     History reviewed. No pertinent past surgical history.  Current Outpatient Prescriptions  Medication Sig Dispense Refill  . escitalopram (LEXAPRO) 10 MG tablet Take 1 tablet by mouth daily.    . naproxen sodium (ANAPROX DS) 550 MG tablet Take 1 tablet (550 mg total) by mouth 2 (two) times daily with a meal. 30 tablet 2  . norethindrone-ethinyl estradiol (JUNEL FE,GILDESS FE,LOESTRIN FE) 1-20 MG-MCG tablet Take 1 tablet by mouth daily. 1 Package 0   No current facility-administered medications for this visit.     ALLERGIES: Review of patient's allergies indicates no known allergies.  Family History  Problem Relation Age of Onset  . Hypertension Sister   . Diabetes Paternal Grandfather   . Hypertension Paternal Grandfather     Social History   Social History  . Marital Status: Single    Spouse Name: N/A  . Number of Children: N/A  . Years of Education: N/A   Occupational History  . Not on  file.   Social History Main Topics  . Smoking status: Never Smoker   . Smokeless tobacco: Never Used  . Alcohol Use: 3.0 oz/week    5 Standard drinks or equivalent per week  . Drug Use: No  . Sexual Activity:    Partners: Male   Other Topics Concern  . Not on file   Social History Narrative    Review of Systems  Constitutional: Negative.   HENT: Negative.   Eyes: Negative.   Respiratory: Negative.   Cardiovascular: Negative.   Gastrointestinal: Negative.   Genitourinary: Negative.   Musculoskeletal: Negative.   Skin: Negative.   Neurological: Negative.   Endo/Heme/Allergies: Negative.   Psychiatric/Behavioral: Negative.     PHYSICAL EXAMINATION:    BP 118/80 mmHg  Pulse 76  Resp 16  Wt 190 lb (86.183 kg)  LMP 01/05/2016    General appearance: alert, cooperative and appears stated age  ASSESSMENT Contraception management, interested in the mirena IUD, reviewed risks and side effects    PLAN She hasn't been sexually active in the last 2 months, on OCP's with regular cycles Return for Mirena IUD insertion Pre-treat with Cytotec    An After Visit Summary was printed and given to the patient.  15 minutes face to face time  of which over 50% was spent in counseling.

## 2016-01-12 NOTE — Patient Instructions (Signed)
Intrauterine Device Insertion Most often, an intrauterine device (IUD) is inserted into the uterus to prevent pregnancy. There are 2 types of IUDs available:  Copper IUD--This type of IUD creates an environment that is not favorable to sperm survival. The mechanism of action of the copper IUD is not known for certain. It can stay in place for 10 years.  Hormone IUD--This type of IUD contains the hormone progestin (synthetic progesterone). The progestin thickens the cervical mucus and prevents sperm from entering the uterus, and it also thins the uterine lining. There is no evidence that the hormone IUD prevents implantation. One hormone IUD can stay in place for up to 5 years, and a different hormone IUD can stay in place for up to 3 years. An IUD is the most cost-effective birth control if left in place for the full duration. It may be removed at any time. LET YOUR HEALTH CARE PROVIDER KNOW ABOUT:  Any allergies you have.  All medicines you are taking, including vitamins, herbs, eye drops, creams, and over-the-counter medicines.  Previous problems you or members of your family have had with the use of anesthetics.  Any blood disorders you have.  Previous surgeries you have had.  Possibility of pregnancy.  Medical conditions you have. RISKS AND COMPLICATIONS  Generally, intrauterine device insertion is a safe procedure. However, as with any procedure, complications can occur. Possible complications include:  Accidental puncture (perforation) of the uterus.  Accidental placement of the IUD either in the muscle layer of the uterus (myometrium) or outside the uterus. If this happens, the IUD can be found essentially floating around the bowels and must be taken out surgically.  The IUD may fall out of the uterus (expulsion). This is more common in women who have recently had a child.   Pregnancy in the fallopian tube (ectopic).  Pelvic inflammatory disease (PID), which is infection of  the uterus and fallopian tubes. The risk of PID is slightly increased in the first 20 days after the IUD is placed, but the overall risk is still very low. BEFORE THE PROCEDURE  Schedule the IUD insertion for when you will have your menstrual period or right after, to make sure you are not pregnant. Placement of the IUD is better tolerated shortly after a menstrual cycle.  You may need to take tests or be examined to make sure you are not pregnant.  You may be required to take a pregnancy test.  You may be required to get checked for sexually transmitted infections (STIs) prior to placement. Placing an IUD in someone who has an infection can make the infection worse.  You may be given a pain reliever to take 1 or 2 hours before the procedure.  An exam will be performed to determine the size and position of your uterus.  Ask your health care provider about changing or stopping your regular medicines. PROCEDURE   A tool (speculum) is placed in the vagina. This allows your health care provider to see the lower part of the uterus (cervix).  The cervix is prepped with a medicine that lowers the risk of infection.  You may be given a medicine to numb each side of the cervix (intracervical or paracervical block). This is used to block and control any discomfort with insertion.  A tool (uterine sound) is inserted into the uterus to determine the length of the uterine cavity and the direction the uterus may be tilted.  A slim instrument (IUD inserter) is inserted through the cervical   canal and into your uterus.  The IUD is placed in the uterine cavity and the insertion device is removed.  The nylon string that is attached to the IUD and used for eventual IUD removal is trimmed. It is trimmed so that it lays high in the vagina, just outside the cervix. AFTER THE PROCEDURE  You may have bleeding after the procedure. This is normal. It varies from light spotting for a few days to menstrual-like  bleeding.  You may have mild cramping.   This information is not intended to replace advice given to you by your health care provider. Make sure you discuss any questions you have with your health care provider.   Document Released: 08/09/2011 Document Revised: 10/01/2013 Document Reviewed: 06/01/2013 Elsevier Interactive Patient Education 2016 [employer]  

## 2016-01-20 ENCOUNTER — Encounter: Payer: Self-pay | Admitting: Obstetrics and Gynecology

## 2016-01-20 ENCOUNTER — Ambulatory Visit (INDEPENDENT_AMBULATORY_CARE_PROVIDER_SITE_OTHER): Payer: 59 | Admitting: Obstetrics and Gynecology

## 2016-01-20 VITALS — BP 118/80 | HR 100 | Resp 16 | Wt 193.0 lb

## 2016-01-20 DIAGNOSIS — Z30430 Encounter for insertion of intrauterine contraceptive device: Secondary | ICD-10-CM | POA: Diagnosis not present

## 2016-01-20 DIAGNOSIS — Z01812 Encounter for preprocedural laboratory examination: Secondary | ICD-10-CM

## 2016-01-20 LAB — POCT URINE PREGNANCY: Preg Test, Ur: NEGATIVE

## 2016-01-20 NOTE — Patient Instructions (Signed)

## 2016-01-20 NOTE — Progress Notes (Signed)
Patient ID: [facility], female   DOB: [birth year], 25 y.o.   MRN: [id number] GYNECOLOGY  VISIT   HPI: 25 y.o.   Single  Caucasian  female   G1P0010 with Patient's last menstrual period was 01/05/2016.   here to have the Mirena IUD inserted. Negative genprobe in 9/16, not sexually active since then.   GYNECOLOGIC HISTORY: Patient's last menstrual period was 01/05/2016. Contraception:OCP Menopausal hormone therapy: None        OB History    Gravida Para Term Preterm AB TAB SAB Ectopic Multiple Living   1 0  0 1  1            There are no active problems to display for this patient.   Past Medical History  Diagnosis Date  . Ovarian cyst   . Anemia   . Anxiety   . Depression   . Endometriosis     History reviewed. No pertinent past surgical history.  Current Outpatient Prescriptions  Medication Sig Dispense Refill  . escitalopram (LEXAPRO) 10 MG tablet Take 1 tablet by mouth daily.    . misoprostol (CYTOTEC) 200 MCG tablet Place 2 tabs in the vagina 6-12 hours prior to the procedure 2 tablet 0  . naproxen sodium (ANAPROX DS) 550 MG tablet Take 1 tablet (550 mg total) by mouth 2 (two) times daily with a meal. 30 tablet 2  . norethindrone-ethinyl estradiol (JUNEL FE,GILDESS FE,LOESTRIN FE) 1-20 MG-MCG tablet Take 1 tablet by mouth daily. (Patient not taking: Reported on 01/20/2016) 1 Package 0   No current facility-administered medications for this visit.     ALLERGIES: Review of patient's allergies indicates no known allergies.  Family History  Problem Relation Age of Onset  . Hypertension Sister   . Diabetes Paternal Grandfather   . Hypertension Paternal Grandfather     Social History   Social History  . Marital Status: Single    Spouse Name: N/A  . Number of Children: N/A  . Years of Education: N/A   Occupational History  . Not on file.   Social History Main Topics  . Smoking status: Never Smoker   . Smokeless tobacco: Never Used  . Alcohol Use: 3.0  oz/week    5 Standard drinks or equivalent per week  . Drug Use: No  . Sexual Activity:    Partners: Male   Other Topics Concern  . Not on file   Social History Narrative    Review of Systems  Constitutional: Negative.   HENT: Negative.   Eyes: Negative.   Respiratory: Negative.   Cardiovascular: Negative.   Gastrointestinal: Negative.   Genitourinary: Negative.   Musculoskeletal: Negative.   Skin: Negative.   Neurological: Negative.   Endo/Heme/Allergies: Negative.   Psychiatric/Behavioral: Negative.    UPT: negative  PHYSICAL EXAMINATION:    BP 118/80 mmHg  Pulse 100  Resp 16  Wt 193 lb (87.544 kg)  LMP 01/05/2016    General appearance: alert, cooperative and appears stated age Pelvic: External genitalia:  no lesions              Urethra:  normal appearing urethra with no masses, tenderness or lesions              Bartholins and Skenes: normal                 Vagina: normal appearing vagina with normal color and discharge, no lesions              Cervix: no  lesions               The risks of the mirena IUD were reviewed with the patient, including infection, abnormal bleeding and uterine perfortion. Consent was signed.  A speculum was placed in the vagina, the cervix was cleansed with betadine. A tenaculum was placed on the cervix, the uterus sounded to 7 cm.  The mirena IUD was inserted without difficulty. The string were cut to 3-4 cm. The tenaculum was removed. Slight oozing from the tenaculum site was stopped with pressure.   The patient tolerated the procedure well.    Chaperone was present for exam.  ASSESSMENT Mirena IUD insertion     PLAN F/U in 1 month Continue to use condoms   An After Visit Summary was printed and given to the patient.

## 2016-02-17 ENCOUNTER — Encounter: Payer: Self-pay | Admitting: Obstetrics and Gynecology

## 2016-02-17 ENCOUNTER — Ambulatory Visit (INDEPENDENT_AMBULATORY_CARE_PROVIDER_SITE_OTHER): Payer: 59 | Admitting: Obstetrics and Gynecology

## 2016-02-17 VITALS — BP 112/70 | HR 80 | Resp 14 | Wt 193.0 lb

## 2016-02-17 DIAGNOSIS — Z30431 Encounter for routine checking of intrauterine contraceptive device: Secondary | ICD-10-CM

## 2016-02-17 NOTE — Progress Notes (Signed)
Patient ID: Tonya Hill, female   DOB: 1991-09-30, 25 y.o.   MRN: [id number] GYNECOLOGY  VISIT   HPI: 25 y.o.   Single  Caucasian  female   [id number] with Patient's last menstrual period was 01/05/2016.   here for IUD recheck, mirena inserted last month. Minimal bleeding since insertion, no real cycle. Cramping with the bleeding, less than normal. She hasn't been sexually active. No c/o.  GYNECOLOGIC HISTORY: Patient's last menstrual period was 01/05/2016. Contraception:Mirena IUD Menopausal hormone therapy: none        OB History    Gravida Para Term Preterm AB TAB SAB Ectopic Multiple Living   1 0  0 1  1            There are no active problems to display for this patient.   Past Medical History  Diagnosis Date  . Ovarian cyst   . Anemia   . Anxiety   . Depression   . Endometriosis     Past Surgical History  Procedure Laterality Date  . Intrauterine device insertion  12/2015    Current Outpatient Prescriptions  Medication Sig Dispense Refill  . escitalopram (LEXAPRO) 10 MG tablet Take 1 tablet by mouth daily.    Marland Kitchen levonorgestrel (MIRENA) 20 MCG/24HR IUD by Intrauterine route.    . naproxen sodium (ANAPROX DS) 550 MG tablet Take 1 tablet (550 mg total) by mouth 2 (two) times daily with a meal. 30 tablet 2   No current facility-administered medications for this visit.     ALLERGIES: Review of patient's allergies indicates no known allergies.  Family History  Problem Relation Age of Onset  . Hypertension Sister   . Diabetes Paternal Grandfather   . Hypertension Paternal Grandfather     Social History   Social History  . Marital Status: Single    Spouse Name: N/A  . Number of Children: N/A  . Years of Education: N/A   Occupational History  . Not on file.   Social History Main Topics  . Smoking status: Never Smoker   . Smokeless tobacco: Never Used  . Alcohol Use: 3.0 oz/week    5 Standard drinks or equivalent per week  . Drug Use: No  . Sexual  Activity:    Partners: Male   Other Topics Concern  . Not on file   Social History Narrative   2/17: she graduated from college in 1/17 with a degree in psychology. She is looking for work and plans to go back to school and get her masters in psychology    Review of Systems  Constitutional: Negative.   HENT: Negative.   Eyes: Negative.   Respiratory: Negative.   Cardiovascular: Negative.   Gastrointestinal: Negative.   Genitourinary: Negative.   Musculoskeletal: Negative.   Skin: Negative.   Neurological: Negative.   Endo/Heme/Allergies: Negative.   Psychiatric/Behavioral: Negative.     PHYSICAL EXAMINATION:    BP 112/70 mmHg  Pulse 80  Resp 14  Wt 193 lb (87.544 kg)  LMP 01/05/2016    General appearance: alert, cooperative and appears stated age  Pelvic: External genitalia:  no lesions              Urethra:  normal appearing urethra with no masses, tenderness or lesions              Bartholins and Skenes: normal                 Vagina: normal appearing vagina with normal color and discharge,  no lesions              Cervix: no lesions and IUD strings 3 cm              Bimanual Exam:  Uterus:  normal size, contour, position, consistency, mobility, non-tender              Adnexa: no mass, fullness, tenderness               Chaperone was present for exam.  ASSESSMENT IUD check, doing well   PLAN F/u in 9/17 for an annual exam   An After Visit Summary was printed and given to the patient.

## 2016-07-07 ENCOUNTER — Emergency Department
Admission: EM | Admit: 2016-07-07 | Discharge: 2016-07-07 | Disposition: A | Discharge status: Home / Self Care | Payer: 59 | Attending: Emergency Medicine | Admitting: Emergency Medicine

## 2016-07-07 ENCOUNTER — Emergency Department: Payer: 59

## 2016-07-07 ENCOUNTER — Encounter: Payer: Self-pay | Admitting: Emergency Medicine

## 2016-07-07 DIAGNOSIS — F329 Major depressive disorder, single episode, unspecified: Secondary | ICD-10-CM | POA: Insufficient documentation

## 2016-07-07 DIAGNOSIS — Z79899 Other long term (current) drug therapy: Secondary | ICD-10-CM | POA: Insufficient documentation

## 2016-07-07 DIAGNOSIS — R109 Unspecified abdominal pain: Secondary | ICD-10-CM

## 2016-07-07 DIAGNOSIS — M545 Low back pain: Secondary | ICD-10-CM | POA: Insufficient documentation

## 2016-07-07 LAB — URINALYSIS COMPLETE WITH MICROSCOPIC (ARMC ONLY)
Bilirubin Urine: NEGATIVE
Glucose, UA: NEGATIVE mg/dL
Hgb urine dipstick: NEGATIVE
Ketones, ur: NEGATIVE mg/dL
Leukocytes, UA: NEGATIVE
Nitrite: NEGATIVE
PH: 6 (ref 5.0–8.0)
PROTEIN: NEGATIVE mg/dL
SPECIFIC GRAVITY, URINE: 1.003 — AB (ref 1.005–1.030)

## 2016-07-07 LAB — PREGNANCY, URINE: PREG TEST UR: NEGATIVE

## 2016-07-07 MED ORDER — OXYCODONE-ACETAMINOPHEN 5-325 MG PO TABS
2.0000 | ORAL_TABLET | Freq: Once | ORAL | Status: AC
  Administered 2016-07-07: 2 via ORAL
  Filled 2016-07-07: qty 2

## 2016-07-07 MED ORDER — DIAZEPAM 5 MG PO TABS: 5.0000 mg | ORAL_TABLET | Freq: Three times a day (TID) | ORAL | Status: DC | PRN

## 2016-07-07 MED ORDER — IBUPROFEN 800 MG PO TABS: 800.0000 mg | ORAL_TABLET | Freq: Three times a day (TID) | ORAL | Status: DC | PRN

## 2016-07-07 NOTE — ED Provider Notes (Signed)
[facility] Emergency Department Provider Note        Time seen: ----------------------------------------- 2:25 PM on 07/07/2016 -----------------------------------------    I have reviewed the triage vital signs and the nursing notes.   HISTORY  Chief Complaint Flank Pain    HPI Tonya Hill is a 25 y.o. female who presents to ER for persistent right flank and right low back pain since last Saturday. She reports she has a history kidney stones, this feels similar. She denies fevers or chills, denies dysuria, vomiting or diarrhea. She denies other complaints at this time.   Past Medical History  Diagnosis Date  . Ovarian cyst   . Anemia   . Anxiety   . Depression   . Endometriosis     There are no active problems to display for this patient.   Past Surgical History  Procedure Laterality Date  . Intrauterine device insertion  12/2015    Allergies Review of patient's allergies indicates no known allergies.  Social History Social History  Substance Use Topics  . Smoking status: Never Smoker   . Smokeless tobacco: Never Used  . Alcohol Use: 3.0 oz/week    5 Standard drinks or equivalent per week    Review of Systems Constitutional: Negative for fever. Eyes: Negative for visual changes. ENT: Negative for sore throat. Cardiovascular: Negative for chest pain. Respiratory: Negative for shortness of breath. Gastrointestinal: Positive for right flank pain Genitourinary: Negative for dysuria. Musculoskeletal: Positive for right-sided low back pain Skin: Negative for rash. Neurological: Negative for headaches, focal weakness or numbness.  10-point ROS otherwise negative.  ____________________________________________   PHYSICAL EXAM:  VITAL SIGNS: ED Triage Vitals  Enc Vitals Group     BP 07/07/16 1139 142/88 mmHg     Pulse Rate 07/07/16 1139 68     Resp 07/07/16 1139 20     Temp 07/07/16 1139 97.8 F (36.6 C)     Temp  Source 07/07/16 1139 Oral     SpO2 07/07/16 1139 99 %     Weight --      Height --      Head Cir --      Peak Flow --      Pain Score 07/07/16 1140 6     Pain Loc --      Pain Edu? --      Excl. in GC? --     Constitutional: Alert and oriented. Well appearing and in no distress. Eyes: Conjunctivae are normal. PERRL. Normal extraocular movements. ENT   Head: Normocephalic and atraumatic.   Nose: No congestion/rhinnorhea.   Mouth/Throat: Mucous membranes are moist.   Neck: No stridor. Cardiovascular: Normal rate, regular rhythm. No murmurs, rubs, or gallops. Respiratory: Normal respiratory effort without tachypnea nor retractions. Breath sounds are clear and equal bilaterally. No wheezes/rales/rhonchi. Gastrointestinal: Soft and nontender. Normal bowel sounds Musculoskeletal: Nontender with normal range of motion in all extremities. No lower extremity tenderness nor edema. Neurologic:  Normal speech and language. No gross focal neurologic deficits are appreciated.  Skin:  Skin is warm, dry and intact. No rash noted. Psychiatric: Mood and affect are normal. Speech and behavior are normal.  ____________________________________________  ED COURSE:  Pertinent labs & imaging results that were available during my care of the patient were reviewed by me and considered in my medical decision making (see chart for details). Patient is no acute distress, I will check a urinalysis and possibly imaging. ____________________________________________    LABS (pertinent positives/negatives)  Labs Reviewed  URINALYSIS COMPLETEWITH MICROSCOPIC ([facility] ONLY) - Abnormal; Notable for the following:    Color, Urine STRAW (*)    APPearance CLEAR (*)    Specific Gravity, Urine 1.003 (*)    Bacteria, UA MANY (*)    Squamous Epithelial / LPF 0-5 (*)    All other components within normal limits  PREGNANCY, URINE    RADIOLOGY Images were viewed by me  CT renal  protocol IMPRESSION: No acute intra-abdominal or intrapelvic abnormalities.  BILATERAL spondylolysis L5.  ____________________________________________  FINAL ASSESSMENT AND PLAN  Flank pain  Plan: Patient with labs and imaging as dictated above. Patient is in no acute distress, no clear etiology for her flank and right low back pain. Likely musculoskeletal. She'll be discharged with Motrin and Valium and referred to her doctor for follow-up.   Emily Filbert, MD   Note: This dictation was prepared with Dragon dictation. Any transcriptional errors that result from this process are unintentional   Emily Filbert, MD 07/07/16 [id number]

## 2016-07-07 NOTE — ED Notes (Signed)
Pt alert and oriented X4, active, cooperative, pt in NAD. RR even and unlabored, color WNL.  Pt informed to return if any life threatening symptoms occur.  Pt left with family. 

## 2016-07-07 NOTE — ED Notes (Signed)
Returned from CT scan.

## 2016-07-07 NOTE — Discharge Instructions (Signed)
Flank Pain °Flank pain refers to pain that is located on the side of the body between the upper abdomen and the back. The pain may occur over a short period of time (acute) or may be long-term or reoccurring (chronic). It may be mild or severe. Flank pain can be caused by many things. °CAUSES  °Some of the more common causes of flank pain include: °· Muscle strains.   °· Muscle spasms.   °· A disease of your spine (vertebral disk disease).   °· A lung infection (pneumonia).   °· Fluid around your lungs (pulmonary edema).   °· A kidney infection.   °· Kidney stones.   °· A very painful skin rash caused by the chickenpox virus (shingles).   °· Gallbladder disease.   °HOME CARE INSTRUCTIONS  °Home care will depend on the cause of your pain. In general, °· Rest as directed by your caregiver. °· Drink enough fluids to keep your urine clear or pale yellow. °· Only take over-the-counter or prescription medicines as directed by your caregiver. Some medicines may help relieve the pain. °· Tell your caregiver about any changes in your pain. °· Follow up with your caregiver as directed. °SEEK IMMEDIATE MEDICAL CARE IF:  °· Your pain is not controlled with medicine.   °· You have new or worsening symptoms. °· Your pain increases.   °· You have abdominal pain.   °· You have shortness of breath.   °· You have persistent nausea or vomiting.   °· You have swelling in your abdomen.   °· You feel faint or pass out.   °· You have blood in your urine. °· You have a fever or persistent symptoms for more than 2-3 days. °· You have a fever and your symptoms suddenly get worse. °MAKE SURE YOU:  °· Understand these instructions. °· Will watch your condition. °· Will get help right away if you are not doing well or get worse. °  °This information is not intended to replace advice given to you by your health care provider. Make sure you discuss any questions you have with your health care provider. °  °Document Released: 02/01/2006 Document  Revised: 09/04/2012 Document Reviewed: 07/25/2012 °Elsevier Interactive Patient Education ©2016 [employer] ° °

## 2016-07-07 NOTE — ED Notes (Signed)
Pt presents with right side flank pain since last Saturday. Pt with hx of kidney stones.

## 2016-09-21 ENCOUNTER — Ambulatory Visit: Payer: 59 | Admitting: Obstetrics and Gynecology

## 2017-01-23 ENCOUNTER — Encounter: Payer: Self-pay | Admitting: Obstetrics and Gynecology

## 2017-01-23 ENCOUNTER — Ambulatory Visit: Payer: 59 | Admitting: Obstetrics and Gynecology

## 2017-10-24 ENCOUNTER — Emergency Department
Admission: EM | Admit: 2017-10-24 | Discharge: 2017-10-24 | Disposition: A | Discharge status: Home / Self Care | Payer: 59 | Attending: Emergency Medicine | Admitting: Emergency Medicine

## 2017-10-24 ENCOUNTER — Encounter: Payer: Self-pay | Admitting: Emergency Medicine

## 2017-10-24 ENCOUNTER — Emergency Department: Payer: 59

## 2017-10-24 DIAGNOSIS — Z91040 Latex allergy status: Secondary | ICD-10-CM | POA: Diagnosis not present

## 2017-10-24 DIAGNOSIS — T148XXA Other injury of unspecified body region, initial encounter: Secondary | ICD-10-CM

## 2017-10-24 DIAGNOSIS — M79605 Pain in left leg: Secondary | ICD-10-CM | POA: Diagnosis present

## 2017-10-24 DIAGNOSIS — Z79899 Other long term (current) drug therapy: Secondary | ICD-10-CM | POA: Insufficient documentation

## 2017-10-24 MED ORDER — CYCLOBENZAPRINE HCL 5 MG PO TABS: 5.0000 mg | ORAL_TABLET | Freq: Three times a day (TID) | ORAL | 0 refills | Status: DC | PRN

## 2017-10-24 NOTE — ED Notes (Signed)
Pt returned from [facility]

## 2017-10-24 NOTE — ED Triage Notes (Signed)
Patient presents to the ED with left leg pain that began yesterday with no known injury.  [facility] because pain is behind patient's knee and is worse when calf is palpated.  Patient is in no obvious distress at this time.  Patient states pain is worse when standing after sitting for a long period.  Patient reports leg is somewhat warm to touch.

## 2017-10-24 NOTE — ED Notes (Signed)
Pt stating pain behind her left knee that began yesterday around 1730. Pt denying any injuries. Pt stating that she does sit at time for prolonged periods at work. Pt is on BC. Pt stating that she has numbness and tingling that comes and goes. Pt stating that she has some edema. No redness or edema noted at this time.

## 2017-10-24 NOTE — ED Provider Notes (Signed)
[facility] Emergency Department Provider Note  Time seen: 7:00 PM  I have reviewed the triage vital signs and the nursing notes.   HISTORY  Chief Complaint Leg Pain    HPI Tonya Hill is a 26 y.o. female with a past medical history of anemia, anxiety depression, presents to the emergency department for left leg pain.  According to the patient for the past 2 days she has been experiencing progressively worsening pain behind her left knee and left calf.  Denies any history of DVT in the past.  Denies any redness or swelling.  Denies fever chest pain or shortness of breath.  Denies any known trauma.  Past Medical History:  Diagnosis Date  . Anemia   . Anxiety   . Depression   . Endometriosis   . Ovarian cyst     There are no active problems to display for this patient.   Past Surgical History:  Procedure Laterality Date  . INTRAUTERINE DEVICE INSERTION  12/2015    Prior to Admission medications   Medication Sig Start Date End Date Taking? Authorizing Provider  diazepam (VALIUM) 5 MG tablet Take 1 tablet (5 mg total) by mouth every 8 (eight) hours as needed for muscle spasms. 07/07/16   Emily FilbertWilliams, Jonathan E, MD  escitalopram (LEXAPRO) 10 MG tablet Take 1 tablet by mouth daily. 09/16/15 02/17/16  [provider]  ibuprofen (ADVIL,MOTRIN) 800 MG tablet Take 1 tablet (800 mg total) by mouth every 8 (eight) hours as needed. 07/07/16   Emily FilbertWilliams, Jonathan E, MD  levonorgestrel (MIRENA) 20 MCG/24HR IUD by Intrauterine route.    [provider]    Allergies  Allergen Reactions  . Latex Rash  . Tape Rash    Family History  Problem Relation Age of Onset  . Hypertension Sister   . Diabetes Paternal Grandfather   . Hypertension Paternal Grandfather     Social History Social History  Substance Use Topics  . Smoking status: Never Smoker  . Smokeless tobacco: Never Used  . Alcohol use 3.0 oz/week    5 Standard drinks or equivalent per  week    Review of Systems Constitutional: Negative for fever. Cardiovascular: Negative for chest pain. Respiratory: Negative for shortness of breath. Gastrointestinal: Negative for abdominal pain Musculoskeletal: Left leg pain Neurological: Negative for headache All other ROS negative  ____________________________________________   PHYSICAL EXAM:  VITAL SIGNS: ED Triage Vitals  Enc Vitals Group     BP 10/24/17 1802 127/77     Pulse Rate 10/24/17 1802 87     Resp 10/24/17 1802 18     Temp 10/24/17 1802 98 F (36.7 C)     Temp Source 10/24/17 1802 Oral     SpO2 10/24/17 1802 100 %     Weight 10/24/17 1758 203 lb (92.1 kg)     Height 10/24/17 1758 5\' 1"  (1.549 m)     Head Circumference --      Peak Flow --      Pain Score 10/24/17 1757 4     Pain Loc --      Pain Edu? --      Excl. in GC? --    Constitutional: Alert and oriented. Well appearing and in no distress. Eyes: Normal exam ENT   Head: Normocephalic and atraumatic   Mouth/Throat: Mucous membranes are moist. Cardiovascular: Normal rate, regular rhythm. No murmur Respiratory: Normal respiratory effort without tachypnea nor retractions. Breath sounds are clear  Gastrointestinal: Soft and nontender. No distention.  Musculoskeletal: Moderate proximal calf tenderness, moderate tenderness to the popliteal fossa.  Neurovascular intact distally 2+ DP pulse.  No erythema or appreciable edema. Neurologic:  Normal speech and language. No gross focal neurologic deficits  Skin:  Skin is warm, dry and intact.  Psychiatric: Mood and affect are normal. Speech and behavior are normal.      RADIOLOGY  Ultrasound negative for DVT  ____________________________________________   INITIAL IMPRESSION / ASSESSMENT AND PLAN / ED COURSE  Pertinent labs & imaging results that were available during my care of the patient were reviewed by me and considered in my medical decision making (see chart for details).  Patient  presents to the emergency department for left leg pain and swelling worse over the past 3 days.  Differential would include DVT, Baker's cyst, musculoskeletal pain.  Will obtain an ultrasound to further evaluate.  Currently there are no signs of edema or erythema to suggest cellulitis.  ultrasound is negative for DVT.  No Baker's cyst noted.  We will discharge with PCP follow-up.  Likely musculoskeletal pain  ____________________________________________   FINAL CLINICAL IMPRESSION(S) / ED DIAGNOSES  Left leg pain Musculoskeletal pain   Minna Antis, MD 10/24/17 1925

## 2018-05-28 ENCOUNTER — Ambulatory Visit: Payer: Managed Care, Other (non HMO) | Attending: Orthopedic Surgery

## 2018-06-04 ENCOUNTER — Ambulatory Visit: Payer: Managed Care, Other (non HMO)

## 2018-09-24 ENCOUNTER — Ambulatory Visit: Payer: Self-pay | Admitting: Emergency Medicine

## 2018-09-24 VITALS — BP 122/80 | HR 89 | Temp 98.7°F | Resp 12

## 2018-09-24 DIAGNOSIS — I499 Cardiac arrhythmia, unspecified: Secondary | ICD-10-CM

## 2018-09-24 DIAGNOSIS — L03116 Cellulitis of left lower limb: Secondary | ICD-10-CM

## 2018-09-24 NOTE — Progress Notes (Signed)
    Sinus arrhythmia. 1 isolated PVC. No significant abnormalities.

## 2018-09-24 NOTE — Progress Notes (Signed)
[facility], [employer].  [place], [place]. 09/24/2018  Subjective:     Patient ID: Tonya Hill, female    DOB: Apr 27, 1991, 27 y.o.   MRN: [id number] Chief complaint: Red swollen left leg after bites  HPI Had multiple ant bites left lower extremity 4 days ago.  Initially was itching but the itching has resolved with Benadryl 25 mg at bedtime and OTC cortisone cream.  However over the past 2 days, complains of worsening redness, warmth, swelling and pain anterior left lower leg, and the ant bites areas have enlarged.  Denies drainage or bleeding or numbness or tingling or weakness.  Denies calf pain. In general, feels slightly fatigued, but no other systemic symptoms.  Denies fever or chills nausea vomiting or lightheadedness. Denies recent foreign travel.  No history of DVT.   Review of Systems Review of systems otherwise negative.  No chest pain or palpitations or shortness of breath or cough or ENT symptoms.  Denies focal neurologic symptoms.  Review of systems otherwise negative  she denies chance of pregnancy.  Past medical history negative for cardiorespiratory disease or diabetes. Allergic to latex and tape.    Objective:   Physical Exam Pleasant female, no acute distress. Blood pressure 122/80, pulse 89, temp 98.7, O2 saturation 99% on room air, respiratory rate 12. HEENT negative. Neck supple, no adenopathy.   Heart: Regular rate and rhythm with occasional ectopic beat heard.  No murmur gallop or rub. Lungs: Clear to auscultation Abdomen: Soft, nontender Left lower extremity: No bony tenderness or joint swelling. No calf tenderness.  No cords.  Homans sign negative. Skin and soft tissue of left ankle, up to L mid shin: Diffusely tender.  Red, warm, swollen anteriorly, some induration without fluctuance.  Especially surrounding 5 raised red indurated warm 2 x 2 centimeter tender areas of skin. Capillary refill neurovascular intact distally.  DP  pulse intact.  Sensation and motor intact.     Assessment & Plan:  Likely had infected ant bites x4 days, now progressed to cellulitis of left lower extremity up to L mid shin.  No clinical evidence of DVT.  No systemic symptoms to suggest sepsis.  (As an incidental note, we had found slightly irregular pulse when checking vital signs, and that prompted me to auscult her heart, and she agreed to and requested EKG/rhythm strip which showed only sinus arrhythmia and one isolated PVC.  I explained that I am not worried about this normal variant of EKG, but I advised she follow-up with PCP for this.)  Treatment options discussed, as well as risks, benefits, alternatives. She declined any parenteral treatment at this time. Patient voiced understanding and agreement with the following plans: Written prescription for Septra DS, #28, 1 refill.  Take 2 p.o. twice daily for 7 to 14 days.  This should especially cover MRSA and gram-negative's . (She declined option of doxycycline as antibiotic.)  Keep leg elevated and watch closely.  Tylenol or ibuprofen as needed pain.  Advised to avoid OTC cortisone topically on the area.  Other advice given.  Follow-up with your primary care doctor in 2-3 days if not improving, or sooner if symptoms become worse. Precautions discussed. Red flags discussed.-Emergency room if any severe symptoms, severe worsening, or any red flags. Questions invited and answered. Patient voiced understanding and agreement.

## 2018-09-27 ENCOUNTER — Emergency Department
Admission: EM | Admit: 2018-09-27 | Discharge: 2018-09-27 | Disposition: A | Discharge status: Home / Self Care | Payer: Managed Care, Other (non HMO) | Attending: Emergency Medicine | Admitting: Emergency Medicine

## 2018-09-27 ENCOUNTER — Other Ambulatory Visit: Payer: Self-pay

## 2018-09-27 DIAGNOSIS — T7840XA Allergy, unspecified, initial encounter: Secondary | ICD-10-CM

## 2018-09-27 DIAGNOSIS — L299 Pruritus, unspecified: Secondary | ICD-10-CM | POA: Diagnosis present

## 2018-09-27 MED ORDER — DEXAMETHASONE SODIUM PHOSPHATE 10 MG/ML IJ SOLN
10.0000 mg | Freq: Once | INTRAMUSCULAR | Status: AC
  Administered 2018-09-27: 10 mg via INTRAVENOUS
  Filled 2018-09-27: qty 1

## 2018-09-27 MED ORDER — AZITHROMYCIN 250 MG PO TABS: ORAL_TABLET | ORAL | 0 refills | Status: DC

## 2018-09-27 MED ORDER — DIPHENHYDRAMINE HCL 50 MG/ML IJ SOLN
25.0000 mg | Freq: Once | INTRAMUSCULAR | Status: AC
  Administered 2018-09-27: 25 mg via INTRAVENOUS
  Filled 2018-09-27: qty 1

## 2018-09-27 NOTE — ED Notes (Signed)
Pt verbalized understanding of discharge instructions. NAD at this time. 

## 2018-09-27 NOTE — ED Triage Notes (Signed)
Pt states that she was bitten by fire ants on Saturday, states that she has been taking benadryl since and was seen by county dr on Tuesday and started on antibiotic, septra and cont to take benadryl, pt states this am she took septra without benadryl and feels like her tongue is too big for her mouth and is having to force herself to swallow. No distress noted at this time, pt able to speak in complete sentences without difficulty, pt states that she started feeling this way at 0915, pt brought over from [facility] urgent care

## 2018-09-27 NOTE — Discharge Instructions (Signed)
Is continue taking the antibiotic and inform your medical doctor that you are allergic to sulfa drugs.  Continue taking Benadryl 1 or 2 capsules every 6 hours as needed for itching.  Increase fluids.  Return to the emergency department over the weekend if any severe worsening of your symptoms.  Tomorrow you may start the Zithromax which was sent to your pharmacy.

## 2018-09-27 NOTE — ED Provider Notes (Signed)
[facility] Emergency Department Provider Note  ____________________________________________   First MD Initiated Contact with Patient 09/27/18 1331     (approximate)  I have reviewed the triage vital signs and the nursing notes.   HISTORY  Chief Complaint Allergic Reaction  HPI Tonya Hill is a 27 y.o. female presents to the ED with complaint of rash.  Patient states that she was bit by what she believes was fire aunts last weekend.  These areas became infected and she was placed on antibiotic.  Since that time she has been itching and taking Benadryl at night.  This morning the itching has become worse.  She denies any difficulty swallowing.  She states that she did not take any Benadryl this morning.  She now states that the itching is "all over".  She denies any difficulties breathing.  She states initially she went to [facility] who brought her to the emergency department.  Past Medical History:  Diagnosis Date  . Anemia   . Anxiety   . Depression   . Endometriosis   . Ovarian cyst     There are no active problems to display for this patient.   Past Surgical History:  Procedure Laterality Date  . INTRAUTERINE DEVICE INSERTION  12/2015    Prior to Admission medications   Medication Sig Start Date End Date Taking? Authorizing Provider  azithromycin (ZITHROMAX Z-PAK) 250 MG tablet Take 2 tablets (500 mg) on  Day 1,  followed by 1 tablet (250 mg) once daily on Days 2 through 5. 09/27/18   Tommi Rumps, PA-C  levonorgestrel (MIRENA) 20 MCG/24HR IUD by Intrauterine route.    [provider]    Allergies Doxycycline; Sulfa antibiotics; Latex; and Tape  Family History  Problem Relation Age of Onset  . Hypertension Sister   . Diabetes Paternal Grandfather   . Hypertension Paternal Grandfather     Social History Social History   Tobacco Use  . Smoking status: Never Smoker  . Smokeless tobacco: Never Used  Substance Use  Topics  . Alcohol use: Yes    Alcohol/week: 5.0 standard drinks    Types: 5 Standard drinks or equivalent per week  . Drug use: No    Review of Systems Constitutional: No fever/chills Cardiovascular: Denies chest pain. Respiratory: Denies shortness of breath. Gastrointestinal:   No nausea, no vomiting.   Musculoskeletal: Negative for back pain. Skin: Left leg fire ant bites with infection.  Diffuse erythema, no hives. Neurological: Negative for headaches, focal weakness or numbness. ___________________________________________   PHYSICAL EXAM:  VITAL SIGNS: ED Triage Vitals  Enc Vitals Group     BP 09/27/18 1311 115/66     Pulse Rate 09/27/18 1311 87     Resp 09/27/18 1311 16     Temp 09/27/18 1311 98.7 F (37.1 C)     Temp Source 09/27/18 1311 Oral     SpO2 09/27/18 1311 100 %     Weight 09/27/18 1312 215 lb (97.5 kg)     Height 09/27/18 1312 5\' 1"  (1.549 m)     Head Circumference --      Peak Flow --      Pain Score 09/27/18 1312 4     Pain Loc --      Pain Edu? --      Excl. in GC? --    Constitutional: Alert and oriented. Well appearing and in no acute distress.  Patient is scratching constantly and appears to be very uncomfortable.  She is talking and answering questions appropriately.  There is no evidence of shortness of breath. Eyes: Conjunctivae are normal. PERRL. EOMI. Head: Atraumatic. Nose: No congestion/rhinnorhea. Mouth/Throat: Mucous membranes are moist.  Oropharynx non-erythematous.  No swelling present.  Feel is midline. Neck: No stridor.   Cardiovascular: Normal rate, regular rhythm. Grossly normal heart sounds.  Good peripheral circulation. Respiratory: Normal respiratory effort.  No retractions. Lungs CTAB. Musculoskeletal: His upper and lower extremities with any difficulty and normal gait was noted. Neurologic:  Normal speech and language. No gross focal neurologic deficits are appreciated.  Skin:  Skin is warm, dry.  Left lateral ankle area there  are multiple erythematous papular areas with surrounding erythema consistent with infected ant bites.  No drainage is present.  Torso, upper extremities and lower extremities with erythema and a scratching pattern consistent with patient itching.  No obvious hives or urticarial lesions were noted. Psychiatric: Mood and affect are normal. Speech and behavior are normal.  ____________________________________________   LABS (all labs ordered are listed, but only abnormal results are displayed)  Labs Reviewed - No data to display   PROCEDURES  Procedure(s) performed: None  Procedures  Critical Care performed: No  ____________________________________________   INITIAL IMPRESSION / ASSESSMENT AND PLAN / ED COURSE  As part of my medical decision making, I reviewed the following data within the electronic MEDICAL RECORD NUMBER Notes from prior ED visits and [place] Controlled Substance Database  Patient arrives to the emergency department with complaint of incredible itching after starting on Bactrim DS for infected ant bites.  Patient has been taking Benadryl at night for discomfort with taking the antibiotic however this morning when she took her antibiotic she did not take Benadryl and began itching severely.  She denies any difficulty breathing.  She denies any similar episode and states that the allergic problem that she has with doxycycline is bad dreams.  Patient was given Decadron and Benadryl IV and was observed.  Prior to discharge patient had no itching and was extremely comfortable.  She is to discontinue taking the Bactrim.  Because there is an area that is still infected with her aunt bites she is to start with the Zithromax for her skin infection starting tomorrow.  She is instructed to return to the emergency department if any severe worsening of her symptoms and continue to take Benadryl as needed.  ____________________________________________   FINAL CLINICAL IMPRESSION(S) / ED  DIAGNOSES  Final diagnoses:  Allergic reaction to drug, initial encounter     ED Discharge Orders         Ordered    azithromycin (ZITHROMAX Z-PAK) 250 MG tablet     09/27/18 1427           Note:  This document was prepared using Dragon voice recognition software and may include unintentional dictation errors.    Tommi Rumps, PA-C 09/27/18 1533    Governor Rooks, MD 09/28/18 [phone]

## 2018-09-27 NOTE — ED Notes (Signed)
See triage note   States she was seen for ant bites to ankles  States she has been taking benadryl with her septra ds for the past 2-3 days   This am did not take benadryl  But now itching all over

## 2018-12-25 DIAGNOSIS — E559 Vitamin D deficiency, unspecified: Secondary | ICD-10-CM

## 2018-12-25 HISTORY — DX: Vitamin D deficiency, unspecified: E55.9

## 2019-05-12 ENCOUNTER — Other Ambulatory Visit: Payer: Self-pay | Admitting: Sports Medicine

## 2019-05-12 DIAGNOSIS — M2391 Unspecified internal derangement of right knee: Secondary | ICD-10-CM

## 2019-05-12 DIAGNOSIS — M2241 Chondromalacia patellae, right knee: Secondary | ICD-10-CM

## 2019-05-12 DIAGNOSIS — M25561 Pain in right knee: Secondary | ICD-10-CM

## 2019-05-12 DIAGNOSIS — G8929 Other chronic pain: Secondary | ICD-10-CM

## 2019-05-20 ENCOUNTER — Ambulatory Visit
Admission: RE | Admit: 2019-05-20 | Discharge: 2019-05-20 | Disposition: A | Discharge status: Home / Self Care | Payer: Managed Care, Other (non HMO) | Source: Ambulatory Visit | Attending: Sports Medicine | Admitting: Sports Medicine

## 2019-05-20 ENCOUNTER — Other Ambulatory Visit: Payer: Self-pay

## 2019-05-20 DIAGNOSIS — G8929 Other chronic pain: Secondary | ICD-10-CM | POA: Diagnosis present

## 2019-05-20 DIAGNOSIS — M25561 Pain in right knee: Secondary | ICD-10-CM | POA: Insufficient documentation

## 2019-05-20 DIAGNOSIS — M2241 Chondromalacia patellae, right knee: Secondary | ICD-10-CM

## 2019-05-20 DIAGNOSIS — M2391 Unspecified internal derangement of right knee: Secondary | ICD-10-CM | POA: Insufficient documentation

## 2019-08-06 ENCOUNTER — Ambulatory Visit: Payer: Managed Care, Other (non HMO) | Admitting: Adult Health

## 2019-08-06 ENCOUNTER — Encounter: Payer: Self-pay | Admitting: Adult Health

## 2019-08-06 VITALS — BP 120/70 | HR 112 | Temp 97.7°F | Resp 18 | Ht 61.0 in | Wt 203.0 lb

## 2019-08-06 DIAGNOSIS — R82998 Other abnormal findings in urine: Secondary | ICD-10-CM | POA: Diagnosis not present

## 2019-08-06 DIAGNOSIS — R1031 Right lower quadrant pain: Secondary | ICD-10-CM | POA: Diagnosis not present

## 2019-08-06 DIAGNOSIS — R319 Hematuria, unspecified: Secondary | ICD-10-CM | POA: Diagnosis not present

## 2019-08-06 LAB — POCT URINALYSIS DIPSTICK
Bilirubin, UA: NEGATIVE
Glucose, UA: NEGATIVE
Ketones, UA: NEGATIVE
Nitrite, UA: NEGATIVE
Protein, UA: NEGATIVE
Spec Grav, UA: 1.015 (ref 1.010–1.025)
Urobilinogen, UA: 0.2 E.U./dL
pH, UA: 6 (ref 5.0–8.0)

## 2019-08-06 LAB — POCT URINE PREGNANCY: Preg Test, Ur: NEGATIVE

## 2019-08-06 MED ORDER — CIPROFLOXACIN HCL 500 MG PO TABS
500.0000 mg | ORAL_TABLET | Freq: Two times a day (BID) | ORAL | 0 refills | Status: DC
Start: 2019-08-06 — End: 2019-10-15

## 2019-08-06 NOTE — Progress Notes (Addendum)
Subjective:    Patient ID: Tonya Hill, female    DOB: December 26, 1990, 26 y.o.   MRN: [id number]  HPI  28 yo female in non acute distress , comes in today with complaints of   RLQ pain  On/Off x  2 weeks .Currently in no pain now.  Described as sharp pain lasting  only a minute or two rating pain  5-6/10.   Approximately one week ago felt dizzy while sitting down at work for a few seconds. Later that day someone warmed up bacon and that caused some nausea. No vomiting.   Denies fevers, had chills one time last week  X  1 day ( checked temp  97.?)  , no vaginal bleeding or discharge. Is  Sexually active.Denies back pain, hematuria or foul odor. Checked a pregnancy test at home which was negative.  Dizziness over the weekend a couple of times. She did have a kidney infection before which caused dizziness and thinks that is what  Is going on today. She also mentions she has a history of ovarian cyst bilaterally. But she can usually tell the difference between kidney infections vs ovarian cyst pain she states.She also has a history of renal stones "this does not feel like that, my pain usually starts in the back and then radiates to the front". No back pain over the last  2 weeks.  Blood pressure 120/70, pulse (!) 112, temperature 97.7 F (36.5 C), temperature source Temporal, resp. rate 18, height 5\' 1"  (1.549 m), weight 203 lb (92.1 kg), SpO2 100 %.  patient states  HR is up since she has been on the phentermine.   Took no antipyretics today.               Past Medical History:  Diagnosis Date  . Anemia   . Anxiety   . Depression   . Endometriosis   . Ovarian cyst    Allergies  Allergen Reactions  . Doxycycline Other (See Comments)    hallucinations  . Sulfa Antibiotics Itching  . Latex Rash  . Tape Rash    Current Outpatient Medications:  .  omeprazole (PRILOSEC) 20 MG capsule, Take by mouth., Disp: , Rfl:  .  phentermine (ADIPEX-P) 37.5 MG tablet, Take by mouth., Disp: ,  Rfl:  .  sertraline (ZOLOFT) 50 MG tablet, TAKE ONE-HALF TABLET BY MOUTH DAILY, MAY INCREASE TO ONE TABLET BY MOUTH DAILY AS NEEDED, Disp: , Rfl:  .  ciprofloxacin (CIPRO) 500 MG tablet, Take 1 tablet (500 mg total) by mouth 2 (two) times daily., Disp: 20 tablet, Rfl: 0 .  levonorgestrel (MIRENA) 20 MCG/24HR IUD, by Intrauterine route., Disp: , Rfl:   Review of Systems  Constitutional: Positive for chills (one day last week, though no temp) and fatigue (but thinks it is due to recent switch from night to days, then some nights due to shortages of personnel).  HENT: Negative.   Eyes: Negative.   Respiratory: Negative.   Cardiovascular: Negative.   Gastrointestinal: Positive for abdominal pain (RLQ x  2 weeks on an off , though no every day, still has appendix.) and nausea (one time last Thursday). Negative for abdominal distention, diarrhea and vomiting.  Endocrine: Negative.   Genitourinary: Negative for decreased urine volume, difficulty urinating, dysuria, flank pain, frequency, hematuria, urgency, vaginal bleeding, vaginal discharge and vaginal pain.  Musculoskeletal: Positive for myalgias (in legs  bilateral , on Saturday that worker her up lasted one day then resolved).  Skin: Negative.  Neurological: Positive for dizziness (on and off on ). Negative for tremors, seizures, syncope, facial asymmetry, speech difficulty, weakness, light-headedness, numbness and headaches.  Hematological: Negative for adenopathy.  Psychiatric/Behavioral: The patient is nervous/anxious (controlled on Zoloft).        Objective:   Physical Exam Vitals signs and nursing note reviewed.  Constitutional:      Appearance: She is well-developed.  HENT:     Head: Normocephalic and atraumatic.  Eyes:     Extraocular Movements: Extraocular movements intact.     Pupils: Pupils are equal, round, and reactive to light.  Cardiovascular:     Rate and Rhythm: Normal rate and regular rhythm.     Heart sounds: Normal  heart sounds.  Pulmonary:     Effort: Pulmonary effort is normal.     Breath sounds: Normal breath sounds.  Abdominal:     General: Abdomen is flat. Bowel sounds are normal. There is no distension.     Palpations: Abdomen is soft. There is no shifting dullness, fluid wave, hepatomegaly, splenomegaly, mass or pulsatile mass.     Tenderness: There is no abdominal tenderness.     Comments: No rebound , no McBurney point, No Rovsings sign,  Skin:    General: Skin is warm and dry.     Capillary Refill: Capillary refill takes less than 2 seconds.  Neurological:     General: No focal deficit present.     Mental Status: She is alert and oriented to person, place, and time.  Psychiatric:        Mood and Affect: Mood normal.        Behavior: Behavior normal.   gait wnl , easily gets on and off the exam table Does not appear in any distress.  Recent Results (from the past 2160 hour(s))  POCT Urinalysis Dipstick (CPT 81002)     Status: Abnormal   Collection Time: 08/06/19  2:11 PM  Result Value Ref Range   Color, UA Yellow    Clarity, UA Clear    Glucose, UA Negative Negative   Bilirubin, UA Negative    Ketones, UA Negative    Spec Grav, UA 1.015 1.010 - 1.025   Blood, UA 3+    pH, UA 6.0 5.0 - 8.0   Protein, UA Negative Negative   Urobilinogen, UA 0.2 0.2 or 1.0 E.U./dL   Nitrite, UA Negative    Leukocytes, UA Trace (A) Negative   Appearance     Odor    POCT Urine Pregnancy (CPT [id number])     Status: None   Collection Time: 08/06/19  2:13 PM  Result Value Ref Range   Preg Test, Ur Negative Negative       Assessment & Plan:  RLQ pain , cyst on right ovary vs renal infection. Patient declined injection of Rocephin. Hematuria and leukocytes in urine, sent for C&S. UA Preg negative. Orders Placed This Encounter  Procedures  . Urine Culture  . POCT Urinalysis Dipstick (CPT 81002)  . POCT Urine Pregnancy (CPT [phone])   Meds ordered this encounter  Medications  . ciprofloxacin  (CIPRO) 500 MG tablet    Sig: Take 1 tablet (500 mg total) by mouth 2 (two) times daily.    Dispense:  20 tablet    Refill:  0  Increase water intake,  gatorade in case she is a little dehydrated due to change in work schedule. Worsening pain to follow up with an urgent care or the Emergency Department for further evaluation and treatment. Patient  verbalies understanding , delclines AVS and has no questions at discharge.

## 2019-08-08 LAB — URINE CULTURE

## 2019-08-11 ENCOUNTER — Encounter: Payer: Self-pay | Admitting: Medical

## 2019-09-08 NOTE — Progress Notes (Deleted)
28 y.o. G1P0010 Single White or Caucasian Not Hispanic or Latino female here for annual exam.      No LMP recorded. (Menstrual status: IUD).          Sexually active: {yes no:314532}  The current method of family planning is {contraception:315051}.    Exercising: {yes no:314532}  {types:19826} Smoker:  {YES P5382123  Health Maintenance: Pap:  09/08/2015 WNL History of abnormal Pap:  No MMG:  N/A Colonoscopy:  N/A BMD:   N/A TDaP:  2012 Gardasil: completed all 3 per patient    reports that she has never smoked. She has never used smokeless tobacco. She reports current alcohol use of about 11.0 standard drinks of alcohol per week. She reports that she does not use drugs.  Past Medical History:  Diagnosis Date  . Anemia   . Anxiety   . Depression   . Endometriosis   . Ovarian cyst     Past Surgical History:  Procedure Laterality Date  . INTRAUTERINE DEVICE INSERTION  12/2015    Current Outpatient Medications  Medication Sig Dispense Refill  . ciprofloxacin (CIPRO) 500 MG tablet Take 1 tablet (500 mg total) by mouth 2 (two) times daily. 20 tablet 0  . levonorgestrel (MIRENA) 20 MCG/24HR IUD by Intrauterine route.    Marland Kitchen omeprazole (PRILOSEC) 20 MG capsule Take by mouth.    . phentermine (ADIPEX-P) 37.5 MG tablet Take by mouth.    . sertraline (ZOLOFT) 50 MG tablet TAKE ONE-HALF TABLET BY MOUTH DAILY, MAY INCREASE TO ONE TABLET BY MOUTH DAILY AS NEEDED     No current facility-administered medications for this visit.     Family History  Problem Relation Age of Onset  . Hypertension Sister   . Diabetes Paternal Grandfather   . Hypertension Paternal Grandfather     Review of Systems  Exam:   There were no vitals taken for this visit.  Weight change: @WEIGHTCHANGE @ Height:      Ht Readings from Last 3 Encounters:  08/06/19 5\' 1"  (1.549 m)  09/27/18 5\' 1"  (1.549 m)  10/24/17 5\' 1"  (1.549 m)    General appearance: alert, cooperative and appears stated age Head:  Normocephalic, without obvious abnormality, atraumatic Neck: no adenopathy, supple, symmetrical, trachea midline and thyroid {CHL AMB PHY EX THYROID NORM [phone]::"normal to inspection and palpation"} Lungs: clear to auscultation bilaterally Cardiovascular: regular rate and rhythm Breasts: {Exam; breast:13139::"normal appearance, no masses or tenderness"} Abdomen: soft, non-tender; non distended,  no masses,  no organomegaly Extremities: extremities normal, atraumatic, no cyanosis or edema Skin: Skin color, texture, turgor normal. No rashes or lesions Lymph nodes: Cervical, supraclavicular, and axillary nodes normal. No abnormal inguinal nodes palpated Neurologic: Grossly normal   Pelvic: External genitalia:  no lesions              Urethra:  normal appearing urethra with no masses, tenderness or lesions              Bartholins and Skenes: normal                 Vagina: normal appearing vagina with normal color and discharge, no lesions              Cervix: {CHL AMB PHY EX CERVIX NORM DEFAULT:[phone]::"no lesions"}               Bimanual Exam:  Uterus:  {CHL AMB PHY EX UTERUS NORM DEFAULT:[phone]::"normal size, contour, position, consistency, mobility, non-tender"}  Adnexa: {CHL AMB PHY EX ADNEXA NO MASS DEFAULT:[phone]::"no mass, fullness, tenderness"}               Rectovaginal: Confirms               Anus:  normal sphincter tone, no lesions  Chaperone was present for exam.  A:  Well Woman with normal exam  P:

## 2019-09-11 ENCOUNTER — Ambulatory Visit: Payer: Managed Care, Other (non HMO) | Admitting: Obstetrics and Gynecology

## 2019-10-13 NOTE — Progress Notes (Signed)
28 y.o. G1P0010 Married White or Caucasian Not Hispanic or Latino female here for annual exam.  She has a mirena IUD, inserted in 1/17. No cycles with the IUD. Prior to the IUD she had heavy, painful cycles.   Planning pregnancy early next year. No family history of chromosomal or genetic abnormalities. She has had the varicella vaccination and then a mild case of chicken pox.   She is on phentermine, just started it again. Plans to be on it for 3 months.     No LMP recorded. (Menstrual status: IUD).          Sexually active: Yes.    The current method of family planning is IUD.    Exercising: Yes.    walking Smoker:  no  Health Maintenance: Pap:  09/08/2015 WNL History of abnormal Pap:  No MMG:  N/A Colonoscopy:  N/A BMD:   N/A TDaP:  2012 Gardasil: completed all 3 per patient    reports that she has never smoked. She has never used smokeless tobacco. She reports current alcohol use of about 3.0 standard drinks of alcohol per week. She reports that she does not use drugs. She is a Hotel manager for [employer].   Past Medical History:  Diagnosis Date  . Anemia   . Anxiety   . Depression   . Endometriosis   . Ovarian cyst     Past Surgical History:  Procedure Laterality Date  . INTRAUTERINE DEVICE INSERTION  12/2015    Current Outpatient Medications  Medication Sig Dispense Refill  . levonorgestrel (MIRENA) 20 MCG/24HR IUD by Intrauterine route.    Marland Kitchen omeprazole (PRILOSEC) 20 MG capsule Take by mouth.    . phentermine (ADIPEX-P) 37.5 MG tablet Take by mouth.    . sertraline (ZOLOFT) 50 MG tablet TAKE ONE-HALF TABLET BY MOUTH DAILY, MAY INCREASE TO ONE TABLET BY MOUTH DAILY AS NEEDED     No current facility-administered medications for this visit.     Family History  Problem Relation Age of Onset  . Hypertension Sister   . Diabetes Paternal Grandfather   . Hypertension Paternal Grandfather   . Hypertension Father     Review of Systems  Constitutional:  Negative.   HENT: Negative.   Eyes: Negative.   Respiratory: Negative.   Cardiovascular: Negative.   Gastrointestinal: Negative.   Endocrine: Negative.   Genitourinary: Negative.   Musculoskeletal: Negative.   Skin: Negative.   Allergic/Immunologic: Negative.   Neurological: Negative.   Hematological: Negative.   Psychiatric/Behavioral: Negative.     Exam:   BP 122/72 (BP Location: Right Arm, Patient Position: Sitting, Cuff Size: Large)   Pulse 72   Temp (!) 97.3 F (36.3 C) (Skin)   Ht 5\' 1"  (1.549 m)   Wt 207 lb 12.8 oz (94.3 kg)   BMI 39.26 kg/m   Weight change: @WEIGHTCHANGE @ Height:   Height: 5\' 1"  (154.9 cm)  Ht Readings from Last 3 Encounters:  10/15/19 5\' 1"  (1.549 m)  08/06/19 5\' 1"  (1.549 m)  09/27/18 5\' 1"  (1.549 m)    General appearance: alert, cooperative and appears stated age Head: Normocephalic, without obvious abnormality, atraumatic Neck: no adenopathy, supple, symmetrical, trachea midline and thyroid normal to inspection and palpation Lungs: clear to auscultation bilaterally Cardiovascular: regular rate and rhythm Breasts: normal appearance, no masses or tenderness Abdomen: soft, non-tender; non distended,  no masses,  no organomegaly Extremities: extremities normal, atraumatic, no cyanosis or edema Skin: Skin color, texture, turgor normal. No rashes or lesions Lymph nodes:  Cervical, supraclavicular, and axillary nodes normal. No abnormal inguinal nodes palpated Neurologic: Grossly normal   Pelvic: External genitalia:  no lesions              Urethra:  normal appearing urethra with no masses, tenderness or lesions              Bartholins and Skenes: normal                 Vagina: normal appearing vagina with normal color and discharge, no lesions              Cervix: no lesions and IUD string 3 cm               Bimanual Exam:  Uterus:  normal size, contour, position, consistency, mobility, non-tender and anteverted              Adnexa: no mass,  fullness, tenderness               Rectovaginal: Confirms               Anus:  normal sphincter tone, no lesions  Chaperone was present for exam.  A:  Well Woman with normal exam  Planning for pregnancy in a few months  Will return for IUD removal when she is ready  BMI 39, on Phentermine. Needs to be off of Phentermine prior to getting pregnant.   Vit d def in diet   P:   Pap with reflex hpv  Screening labs, HgbA1C, TSH, vit d  Std testing  Discussed good health prior to pregnancy and things to avoid if she could be pregnant  Discussed breast self exam  Discussed calcium and vit D intake  Start PNV 1-2 months prior to removing IUD  Return for IUD removal when ready.

## 2019-10-15 ENCOUNTER — Encounter: Payer: Self-pay | Admitting: Obstetrics and Gynecology

## 2019-10-15 ENCOUNTER — Other Ambulatory Visit: Payer: Self-pay

## 2019-10-15 ENCOUNTER — Other Ambulatory Visit (HOSPITAL_COMMUNITY)
Admission: RE | Admit: 2019-10-15 | Discharge: 2019-10-15 | Disposition: A | Discharge status: Home / Self Care | Payer: Managed Care, Other (non HMO) | Source: Ambulatory Visit | Attending: Obstetrics and Gynecology | Admitting: Obstetrics and Gynecology

## 2019-10-15 ENCOUNTER — Ambulatory Visit: Payer: Managed Care, Other (non HMO) | Admitting: Obstetrics and Gynecology

## 2019-10-15 VITALS — BP 122/72 | HR 72 | Temp 97.3°F | Ht 61.0 in | Wt 207.8 lb

## 2019-10-15 DIAGNOSIS — Z124 Encounter for screening for malignant neoplasm of cervix: Secondary | ICD-10-CM | POA: Insufficient documentation

## 2019-10-15 DIAGNOSIS — Z3169 Encounter for other general counseling and advice on procreation: Secondary | ICD-10-CM

## 2019-10-15 DIAGNOSIS — E559 Vitamin D deficiency, unspecified: Secondary | ICD-10-CM

## 2019-10-15 DIAGNOSIS — Z01419 Encounter for gynecological examination (general) (routine) without abnormal findings: Secondary | ICD-10-CM

## 2019-10-15 DIAGNOSIS — Z0000 Encounter for general adult medical examination without abnormal findings: Secondary | ICD-10-CM

## 2019-10-15 DIAGNOSIS — Z30431 Encounter for routine checking of intrauterine contraceptive device: Secondary | ICD-10-CM

## 2019-10-15 DIAGNOSIS — Z113 Encounter for screening for infections with a predominantly sexual mode of transmission: Secondary | ICD-10-CM | POA: Insufficient documentation

## 2019-10-15 DIAGNOSIS — Z3009 Encounter for other general counseling and advice on contraception: Secondary | ICD-10-CM

## 2019-10-15 DIAGNOSIS — Z6839 Body mass index (BMI) 39.0-39.9, adult: Secondary | ICD-10-CM

## 2019-10-15 NOTE — Patient Instructions (Signed)
EXERCISE AND DIET:  We recommended that you start or continue a regular exercise program for good health. Regular exercise means any activity that makes your heart beat faster and makes you sweat.  We recommend exercising at least 30 minutes per day at least 3 days a week, preferably 4 or 5.  We also recommend a diet low in fat and sugar.  Inactivity, poor dietary choices and obesity can cause diabetes, heart attack, stroke, and kidney damage, among others.   ° °ALCOHOL AND SMOKING:  Women should limit their alcohol intake to no more than 7 drinks/beers/glasses of wine (combined, not each!) per week. Moderation of alcohol intake to this level decreases your risk of breast cancer and liver damage. And of course, no recreational drugs are part of a healthy lifestyle.  And absolutely no smoking or even second hand smoke. Most people know smoking can cause heart and lung diseases, but did you know it also contributes to weakening of your bones? Aging of your skin?  Yellowing of your teeth and nails? ° °CALCIUM AND VITAMIN D:  Adequate intake of calcium and Vitamin D are recommended.  The recommendations for exact amounts of these supplements seem to change often, but generally speaking 1,000 mg of calcium (between diet and supplement) and 800 units of Vitamin D per day seems prudent. Certain women may benefit from higher intake of Vitamin D.  If you are among these women, your doctor will have told you during your visit.   ° °PAP SMEARS:  Pap smears, to check for cervical cancer or precancers,  have traditionally been done yearly, although recent scientific advances have shown that most women can have pap smears less often.  However, every woman still should have a physical exam from her gynecologist every year. It will include a breast check, inspection of the vulva and vagina to check for abnormal growths or skin changes, a visual exam of the cervix, and then an exam to evaluate the size and shape of the uterus and  ovaries.  And after 26 years of age, a rectal exam is indicated to check for rectal cancers. We will also provide age appropriate advice regarding health maintenance, like when you should have certain vaccines, screening for sexually transmitted diseases, bone density testing, colonoscopy, mammograms, etc.  ° °MAMMOGRAMS:  All women over 40 years old should have a yearly mammogram. Many facilities now offer a "3D" mammogram, which may cost around $50 extra out of pocket. If possible,  we recommend you accept the option to have the 3D mammogram performed.  It both reduces the number of women who will be called back for extra views which then turn out to be normal, and it is better than the routine mammogram at detecting truly abnormal areas.   ° °COLON CANCER SCREENING: Now recommend starting at age 45. At this time colonoscopy is not covered for routine screening until 50. There are take home tests that can be done between 45-49.  ° °COLONOSCOPY:  Colonoscopy to screen for colon cancer is recommended for all women at age 50.  We know, you hate the idea of the prep.  We agree, BUT, having colon cancer and not knowing it is worse!!  Colon cancer so often starts as a polyp that can be seen and removed at colonscopy, which can quite literally save your life!  And if your first colonoscopy is normal and you have no family history of colon cancer, most women don't have to have it again for   10 years.  Once every ten years, you can do something that may end up saving your life, right?  We will be happy to help you get it scheduled when you are ready.  Be sure to check your insurance coverage so you understand how much it will cost.  It may be covered as a preventative service at no cost, but you should check your particular policy.   ° ° ° °Breast Self-Awareness °Breast self-awareness means being familiar with how your breasts look and feel. It involves checking your breasts regularly and reporting any changes to your  health care provider. °Practicing breast self-awareness is important. A change in your breasts can be a sign of a serious medical problem. Being familiar with how your breasts look and feel allows you to find any problems early, when treatment is more likely to be successful. All women should practice breast self-awareness, including women who have had breast implants. °How to do a breast self-exam °One way to learn what is normal for your breasts and whether your breasts are changing is to do a breast self-exam. To do a breast self-exam: °Look for Changes ° °1. Remove all the clothing above your waist. °2. Stand in front of a mirror in a room with good lighting. °3. Put your hands on your hips. °4. Push your hands firmly downward. °5. Compare your breasts in the mirror. Look for differences between them (asymmetry), such as: °? Differences in shape. °? Differences in size. °? Puckers, dips, and bumps in one breast and not the other. °6. Look at each breast for changes in your skin, such as: °? Redness. °? Scaly areas. °7. Look for changes in your nipples, such as: °? Discharge. °? Bleeding. °? Dimpling. °? Redness. °? A change in position. °Feel for Changes °Carefully feel your breasts for lumps and changes. It is best to do this while lying on your back on the floor and again while sitting or standing in the shower or tub with soapy water on your skin. Feel each breast in the following way: °· Place the arm on the side of the breast you are examining above your head. °· Feel your breast with the other hand. °· Start in the nipple area and make ¾ inch (2 cm) overlapping circles to feel your breast. Use the pads of your three middle fingers to do this. Apply light pressure, then medium pressure, then firm pressure. The light pressure will allow you to feel the tissue closest to the skin. The medium pressure will allow you to feel the tissue that is a little deeper. The firm pressure will allow you to feel the tissue  close to the ribs. °· Continue the overlapping circles, moving downward over the breast until you feel your ribs below your breast. °· Move one finger-width toward the center of the body. Continue to use the ¾ inch (2 cm) overlapping circles to feel your breast as you move slowly up toward your collarbone. °· Continue the up and down exam using all three pressures until you reach your armpit. ° °Write Down What You Find ° °Write down what is normal for each breast and any changes that you find. Keep a written record with breast changes or normal findings for each breast. By writing this information down, you do not need to depend only on memory for size, tenderness, or location. Write down where you are in your menstrual cycle, if you are still menstruating. °If you are having trouble noticing differences   in your breasts, do not get discouraged. With time you will become more familiar with the variations in your breasts and more comfortable with the exam. °How often should I examine my breasts? °Examine your breasts every month. If you are breastfeeding, the best time to examine your breasts is after a feeding or after using a breast pump. If you menstruate, the best time to examine your breasts is 5-7 days after your period is over. During your period, your breasts are lumpier, and it may be more difficult to notice changes. °When should I see my health care provider? °See your health care provider if you notice: °· A change in shape or size of your breasts or nipples. °· A change in the skin of your breast or nipples, such as a reddened or scaly area. °· Unusual discharge from your nipples. °· A lump or thick area that was not there before. °· Pain in your breasts. °· Anything that concerns you. ° °Preparing for Pregnancy °If you are considering becoming pregnant, make an appointment to see your regular health care provider to learn how to prepare for a safe and healthy pregnancy (preconception care). During a  preconception care visit, your health care provider will: °· Do a complete physical exam, including a Pap test. °· Take a complete medical history. °· Give you information, answer your questions, and help you resolve problems. °Preconception checklist °Medical history °· Tell your health care provider about any current or past medical conditions. Your pregnancy or your ability to become pregnant may be affected by chronic conditions, such as diabetes, chronic hypertension, and thyroid problems. °· Include your family's medical history as well as your partner's medical history. °· Tell your health care provider about any history of STIs (sexually transmitted infections). These can affect your pregnancy. In some cases, they can be passed to your baby. Discuss any concerns that you have about STIs. °· If indicated, discuss the benefits of genetic testing. This testing will show whether there are any genetic conditions that may be passed from you or your partner to your baby. °· Tell your health care provider about: °? Any problems you have had with conception or pregnancy. °? Any medicines you take. These include vitamins, herbal supplements, and over-the-counter medicines. °? Your history of immunizations. Discuss any vaccinations that you may need. °Diet °· Ask your health care provider what to include in a healthy diet that has a balance of nutrients. This is especially important when you are pregnant or preparing to become pregnant. °· Ask your health care provider to help you reach a healthy weight before pregnancy. °? If you are overweight, you may be at higher risk for certain complications, such as high blood pressure, diabetes, and preterm birth. °? If you are underweight, you are more likely to have a baby who has a low birth weight. °Lifestyle, work, and home °· Let your health care provider know: °? About any lifestyle habits that you have, such as alcohol use, drug use, or smoking. °? About recreational  activities that may put you at risk during pregnancy, such as downhill skiing and certain exercise programs. °? Tell your health care provider about any international travel, especially any travel to places with an active Zika virus outbreak. °? About harmful substances that you may be exposed to at work or at home. These include chemicals, pesticides, radiation, or even litter boxes. °? If you do not feel safe at home. °Mental health °· Tell your health care provider about: °?   Any history of mental health conditions, including feelings of depression, sadness, or anxiety. °? Any medicines that you take for a mental health condition. These include herbs and supplements. °Home instructions to prepare for pregnancy °Lifestyle ° °· Eat a balanced diet. This includes fresh fruits and vegetables, whole grains, lean meats, low-fat dairy products, healthy fats, and foods that are high in fiber. Ask to meet with a nutritionist or registered dietitian for assistance with meal planning and goals. °· Get regular exercise. Try to be active for at least 30 minutes a day on most days of the week. Ask your health care provider which activities are safe during pregnancy. °· Do not use any products that contain nicotine or tobacco, such as cigarettes and e-cigarettes. If you need help quitting, ask your health care provider. °· Do not drink alcohol. °· Do not take illegal drugs. °· Maintain a healthy weight. Ask your health care provider what weight range is right for you. °General instructions °· Keep an accurate record of your menstrual periods. This makes it easier for your health care provider to determine your baby's due date. °· Begin taking prenatal vitamins and folic acid supplements daily as directed by your health care provider. °· Manage any chronic conditions, such as high blood pressure and diabetes, as told by your health care provider. This is important. °How do I know that I am pregnant? °You may be pregnant if you  have been sexually active and you miss your period. Symptoms of early pregnancy include: °· Mild cramping. °· Very light vaginal bleeding (spotting). °· Feeling unusually tired. °· Nausea and vomiting (morning sickness). °If you have any of these symptoms and you suspect that you might be pregnant, you can take a home pregnancy test. These tests check for a hormone in your urine (human chorionic gonadotropin, or hCG). A woman's body begins to make this hormone during early pregnancy. These tests are very accurate. Wait until at least the first day after you miss your period to take one. If the test shows that you are pregnant (you get a positive result), call your health care provider to make an appointment for prenatal care. °What should I do if I become pregnant? ° °  ° °· Make an appointment with your health care provider as soon as you suspect you are pregnant. °· Do not use any products that contain nicotine, such as cigarettes, chewing tobacco, and e-cigarettes. If you need help quitting, ask your health care provider. °· Do not drink alcoholic beverages. Alcohol is related to a number of birth defects. °· Avoid toxic odors and chemicals. °· You may continue to have sexual intercourse if it does not cause pain or other problems, such as vaginal bleeding. °This information is not intended to replace advice given to you by your health care provider. Make sure you discuss any questions you have with your health care provider. °Document Released: 11/23/2008 Document Revised: 12/13/2017 Document Reviewed: 07/02/2016 °Elsevier Patient Education © 2020 [employer] ° °

## 2019-10-16 ENCOUNTER — Other Ambulatory Visit: Payer: Self-pay | Admitting: *Deleted

## 2019-10-16 DIAGNOSIS — Z30432 Encounter for removal of intrauterine contraceptive device: Secondary | ICD-10-CM

## 2019-10-16 LAB — COMPREHENSIVE METABOLIC PANEL
ALT: 9 IU/L (ref 0–32)
AST: 14 IU/L (ref 0–40)
Albumin/Globulin Ratio: 1.7 (ref 1.2–2.2)
Albumin: 4.3 g/dL (ref 3.9–5.0)
Alkaline Phosphatase: 100 IU/L (ref 39–117)
BUN/Creatinine Ratio: 10 (ref 9–23)
BUN: 9 mg/dL (ref 6–20)
Bilirubin Total: 0.3 mg/dL (ref 0.0–1.2)
CO2: 24 mmol/L (ref 20–29)
Calcium: 8.7 mg/dL (ref 8.7–10.2)
Chloride: 103 mmol/L (ref 96–106)
Creatinine, Ser: 0.89 mg/dL (ref 0.57–1.00)
GFR calc Af Amer: 103 mL/min/{1.73_m2} (ref >59)
GFR calc non Af Amer: 90 mL/min/{1.73_m2} (ref >59)
Globulin, Total: 2.6 g/dL (ref 1.5–4.5)
Glucose: 88 mg/dL (ref 65–99)
Potassium: 4 mmol/L (ref 3.5–5.2)
Sodium: 137 mmol/L (ref 134–144)
Total Protein: 6.9 g/dL (ref 6.0–8.5)

## 2019-10-16 LAB — CBC
Hematocrit: 39.5 % (ref 34.0–46.6)
Hemoglobin: 12.7 g/dL (ref 11.1–15.9)
MCH: 28.5 pg (ref 26.6–33.0)
MCHC: 32.2 g/dL (ref 31.5–35.7)
MCV: 89 fL (ref 79–97)
Platelets: 420 10*3/uL (ref 150–450)
RBC: 4.45 x10E6/uL (ref 3.77–5.28)
RDW: 12 % (ref 11.7–15.4)
WBC: 9.1 10*3/uL (ref 3.4–10.8)

## 2019-10-16 LAB — HEMOGLOBIN A1C
Est. average glucose Bld gHb Est-mCnc: 105 mg/dL
Hgb A1c MFr Bld: 5.3 % (ref 4.8–5.6)

## 2019-10-16 LAB — LIPID PANEL
Chol/HDL Ratio: 3.3 ratio (ref 0.0–4.4)
Cholesterol, Total: 193 mg/dL (ref 100–199)
HDL: 58 mg/dL (ref >39)
LDL Chol Calc (NIH): 103 mg/dL — ABNORMAL HIGH (ref 0–99)
Triglycerides: 185 mg/dL — ABNORMAL HIGH (ref 0–149)
VLDL Cholesterol Cal: 32 mg/dL (ref 5–40)

## 2019-10-16 LAB — TSH: TSH: 1.92 u[IU]/mL (ref 0.450–4.500)

## 2019-10-16 LAB — VITAMIN D 25 HYDROXY (VIT D DEFICIENCY, FRACTURES): Vit D, 25-Hydroxy: 16.2 ng/mL — ABNORMAL LOW (ref 30.0–100.0)

## 2019-10-16 LAB — RPR: RPR Ser Ql: NONREACTIVE

## 2019-10-16 LAB — RUBELLA SCREEN: Rubella Antibodies, IGG: 1.59 index (ref >0.99)

## 2019-10-16 LAB — HIV ANTIBODY (ROUTINE TESTING W REFLEX): HIV Screen 4th Generation wRfx: NONREACTIVE

## 2019-10-17 LAB — CYTOLOGY - PAP
Chlamydia: NEGATIVE
Comment: NEGATIVE
Comment: NORMAL
Diagnosis: NEGATIVE
Neisseria Gonorrhea: NEGATIVE

## 2019-10-22 ENCOUNTER — Telehealth: Payer: Self-pay | Admitting: Obstetrics and Gynecology

## 2019-10-22 ENCOUNTER — Other Ambulatory Visit: Payer: Self-pay | Admitting: Obstetrics and Gynecology

## 2019-10-22 DIAGNOSIS — E559 Vitamin D deficiency, unspecified: Secondary | ICD-10-CM

## 2019-10-22 DIAGNOSIS — E781 Pure hyperglyceridemia: Secondary | ICD-10-CM

## 2019-10-22 NOTE — Telephone Encounter (Signed)
Routing to Dr. Talbert Nan to review and advise on results from 10-15-2019.

## 2019-10-22 NOTE — Telephone Encounter (Signed)
Patient calling for lab results from [facility] 10/21

## 2019-10-22 NOTE — Telephone Encounter (Signed)
Result note was sent to the patient earlier today.

## 2019-10-22 NOTE — Telephone Encounter (Signed)
Results sent to patient via MyChart and patient has reviewed them. Message left for patient to return call at her convenience to schedule 3 month fasting lab appointment.

## 2019-11-03 NOTE — Telephone Encounter (Signed)
Lab orders previously placed.  Encounter closed.

## 2019-11-11 ENCOUNTER — Telehealth: Payer: Self-pay | Admitting: Obstetrics and Gynecology

## 2019-11-11 NOTE — Telephone Encounter (Signed)
Patient is returning call to [place]. Patient requests that call be returned on work number at [phone]

## 2019-11-11 NOTE — Telephone Encounter (Signed)
Patient cancelled iud procedure scheduled in January and would like a call to reschedule. Please call work number [phone].

## 2019-11-11 NOTE — Telephone Encounter (Signed)
Spoke with patient. Patient request to schedule IUD removal and fasting labs same day. IUD removal and fasting labs scheduled for 01/20/19 at 9:30am with Dr. Talbert Nan.   Order previously placed for IUD removal.   Routing to provider for final review. Patient is agreeable to disposition. Will close encounter.  Cc: Lerry Liner, Magdalene Patricia

## 2019-11-11 NOTE — Telephone Encounter (Signed)
Left message to call Sharee Pimple, RN at [facility].    AEX 10/15/19 Mirena IUD placed 12/2015

## 2019-12-28 ENCOUNTER — Other Ambulatory Visit: Payer: Self-pay

## 2019-12-28 ENCOUNTER — Ambulatory Visit
Admission: EM | Admit: 2019-12-28 | Discharge: 2019-12-28 | Disposition: A | Discharge status: Home / Self Care | Payer: Managed Care, Other (non HMO) | Attending: Family Medicine | Admitting: Family Medicine

## 2019-12-28 ENCOUNTER — Encounter: Payer: Self-pay | Admitting: Emergency Medicine

## 2019-12-28 DIAGNOSIS — Z20822 Contact with and (suspected) exposure to covid-19: Secondary | ICD-10-CM

## 2019-12-28 DIAGNOSIS — Z20828 Contact with and (suspected) exposure to other viral communicable diseases: Secondary | ICD-10-CM

## 2019-12-28 DIAGNOSIS — J029 Acute pharyngitis, unspecified: Secondary | ICD-10-CM | POA: Diagnosis present

## 2019-12-28 LAB — GROUP A STREP BY PCR: Group A Strep by PCR: NOT DETECTED

## 2019-12-28 MED ORDER — IBUPROFEN 800 MG PO TABS: 800.0000 mg | ORAL_TABLET | Freq: Three times a day (TID) | ORAL | 0 refills | Status: DC | PRN

## 2019-12-28 NOTE — ED Provider Notes (Signed)
[facility]    CSN: [id number] Arrival date & time: 12/28/19  1517      History   Chief Complaint Chief Complaint  Patient presents with   Sore Throat   HPI  29 year old female presents with sore throat.  Patient reports symptoms started yesterday.  She reports sore throat and fatigue.  Denies fever.  She reports that her boss recently contracted COVID-19.  She states that they have not been in close contact.  Patient states that she has been around someone who has strep throat.  She is concerned that she has strep throat.  Sore throat is moderate in severity.  Seems to be worsening and not improving.  No relieving factors.  No other associated symptoms.  No other complaints.  PMH, Surgical Hx, Family Hx, Social History reviewed and updated as below.  Past Medical History:  Diagnosis Date   Anemia    Anxiety    Depression    Endometriosis    Ovarian cyst     Patient Active Problem List   Diagnosis Date Noted   Anxiety and depression 09/17/2015   Obesity, Class II, BMI 35-39.9 09/17/2015    Past Surgical History:  Procedure Laterality Date   INTRAUTERINE DEVICE INSERTION  12/2015    OB History    Gravida  1   Para  0   Term      Preterm  0   AB  1   Living        SAB  1   TAB      Ectopic      Multiple      Live Births               Home Medications    Prior to Admission medications   Medication Sig Start Date End Date Taking? Authorizing Provider  levonorgestrel (MIRENA) 20 MCG/24HR IUD by Intrauterine route.   Yes [provider]  omeprazole (PRILOSEC) 20 MG capsule Take by mouth. 02/25/19 02/25/20 Yes [provider]  sertraline (ZOLOFT) 50 MG tablet TAKE ONE-HALF TABLET BY MOUTH DAILY, MAY INCREASE TO ONE TABLET BY MOUTH DAILY AS NEEDED 10/29/18  Yes [provider]  ibuprofen (ADVIL) 800 MG tablet Take 1 tablet (800 mg total) by mouth every 8 (eight) hours as needed for moderate pain. 12/28/19    Tommie Sams, DO  phentermine (ADIPEX-P) 37.5 MG tablet Take by mouth. 06/02/19 12/28/19  [provider]    Family History Family History  Problem Relation Age of Onset   Hypertension Sister    Diabetes Paternal Grandfather    Hypertension Paternal Grandfather    Hypertension Father     Social History Social History   Tobacco Use   Smoking status: Never Smoker   Smokeless tobacco: Never Used  Substance Use Topics   Alcohol use: Yes    Alcohol/week: 3.0 standard drinks    Types: 3 Standard drinks or equivalent per week   Drug use: No     Allergies   Sulfa antibiotics, Doxycycline, Latex, and Tape   Review of Systems Review of Systems  Constitutional: Positive for fatigue. Negative for fever.  HENT: Positive for sore throat.    Physical Exam Triage Vital Signs ED Triage Vitals  Enc Vitals Group     BP 12/28/19 1538 125/67     Pulse Rate 12/28/19 1538 90     Resp 12/28/19 1538 16     Temp 12/28/19 1538 98.5 F (36.9 C)  Temp Source 12/28/19 1538 Oral     SpO2 12/28/19 1538 100 %     Weight 12/28/19 1535 212 lb (96.2 kg)     Height 12/28/19 1535 5\' 1"  (1.549 m)     Head Circumference --      Peak Flow --      Pain Score 12/28/19 1534 5     Pain Loc --      Pain Edu? --      Excl. in [place]? --    Updated Vital Signs BP 125/67 (BP Location: Left Arm)    Pulse 90    Temp 98.5 F (36.9 C) (Oral)    Resp 16    Ht 5\' 1"  (1.549 m)    Wt 96.2 kg    SpO2 100%    BMI 40.06 kg/m   Visual Acuity Right Eye Distance:   Left Eye Distance:   Bilateral Distance:    Right Eye Near:   Left Eye Near:    Bilateral Near:     Physical Exam Vitals and nursing note reviewed.  Constitutional:      General: She is not in acute distress.    Appearance: Normal appearance. She is not ill-appearing.  HENT:     Head: Normocephalic and atraumatic.     Mouth/Throat:     Pharynx: Posterior oropharyngeal erythema present. No oropharyngeal exudate.  Eyes:      General:        Right eye: No discharge.        Left eye: No discharge.     Conjunctiva/sclera: Conjunctivae normal.  Cardiovascular:     Rate and Rhythm: Normal rate and regular rhythm.     Heart sounds: No murmur.  Pulmonary:     Effort: Pulmonary effort is normal.     Breath sounds: Normal breath sounds. No wheezing, rhonchi or rales.  Neurological:     Mental Status: She is alert.  Psychiatric:        Mood and Affect: Mood normal.        Behavior: Behavior normal.    UC Treatments / Results  Labs (all labs ordered are listed, but only abnormal results are displayed) Labs Reviewed  GROUP A STREP BY PCR  NOVEL CORONAVIRUS, NAA (HOSP ORDER, SEND-OUT TO REF LAB; TAT 18-24 HRS)    EKG   Radiology No results found.  Procedures Procedures (including critical care time)  Medications Ordered in UC Medications - No data to display  Initial Impression / Assessment and Plan / UC Course  I have reviewed the triage vital signs and the nursing notes.  Pertinent labs & imaging results that were available during my care of the patient were reviewed by me and considered in my medical decision making (see chart for details).    29 year old female presents with viral pharyngitis.  Awaiting Covid test results. Ibuprofen as needed.  Supportive care.  Final Clinical Impressions(s) / UC Diagnoses   Final diagnoses:  Viral pharyngitis  Encounter for laboratory testing for COVID-19 virus     Discharge Instructions     Results available in 24 to 48 hours.  Medication as prescribed. Fluids. Rest.  Stay home.  Take care  Dr. Lacinda Axon     ED Prescriptions    Medication Sig Dispense Auth. Provider   ibuprofen (ADVIL) 800 MG tablet Take 1 tablet (800 mg total) by mouth every 8 (eight) hours as needed for moderate pain. 30 tablet Coral Spikes, DO     PDMP not reviewed  this encounter.   Tommie Sams, [place] 12/28/19 1715

## 2019-12-28 NOTE — Discharge Instructions (Signed)
Results available in 24 to 48 hours.  Medication as prescribed. Fluids. Rest.  Stay home.  Take care  Dr. Adriana Simas

## 2019-12-28 NOTE — ED Triage Notes (Signed)
Patient c/o sore throat and fatigue that started yesterday.  Patient denies fevers.

## 2019-12-30 LAB — NOVEL CORONAVIRUS, NAA (HOSP ORDER, SEND-OUT TO REF LAB; TAT 18-24 HRS): SARS-CoV-2, NAA: NOT DETECTED

## 2020-01-07 ENCOUNTER — Telehealth: Payer: Self-pay | Admitting: Obstetrics and Gynecology

## 2020-01-07 NOTE — Telephone Encounter (Signed)
Call placed to convey benefits for iud removal. 

## 2020-01-12 ENCOUNTER — Ambulatory Visit: Payer: Managed Care, Other (non HMO) | Admitting: Obstetrics and Gynecology

## 2020-01-20 ENCOUNTER — Other Ambulatory Visit: Payer: Self-pay

## 2020-01-21 ENCOUNTER — Ambulatory Visit (INDEPENDENT_AMBULATORY_CARE_PROVIDER_SITE_OTHER): Payer: Managed Care, Other (non HMO) | Admitting: Obstetrics and Gynecology

## 2020-01-21 ENCOUNTER — Encounter: Payer: Self-pay | Admitting: Obstetrics and Gynecology

## 2020-01-21 VITALS — BP 118/64 | HR 97 | Temp 98.4°F | Ht 61.0 in | Wt 217.0 lb

## 2020-01-21 DIAGNOSIS — Z3169 Encounter for other general counseling and advice on procreation: Secondary | ICD-10-CM | POA: Diagnosis not present

## 2020-01-21 DIAGNOSIS — Z30432 Encounter for removal of intrauterine contraceptive device: Secondary | ICD-10-CM

## 2020-01-21 DIAGNOSIS — E781 Pure hyperglyceridemia: Secondary | ICD-10-CM

## 2020-01-21 DIAGNOSIS — E559 Vitamin D deficiency, unspecified: Secondary | ICD-10-CM

## 2020-01-21 DIAGNOSIS — F419 Anxiety disorder, unspecified: Secondary | ICD-10-CM

## 2020-01-21 NOTE — Progress Notes (Signed)
GYNECOLOGY  VISIT   HPI: 29 y.o.   Married White or Caucasian Not Hispanic or Latino  female   G1P0010 with No LMP recorded. (Menstrual status: IUD).   here for   IUD removal and lab follow up.   She is planning on getting pregnant. She is on PNV. She was just started on Wellbutrin and lorazepam for anxiety, and issues with attention. She has tried [employer] without help in the past.  Lexapro made her apathetic, Zoloft helped a little but made her tired She had a very low vit d and an elevated triglyceride and LDL at her annual exam in 10/20 and is due for repeat blood work.   GYNECOLOGIC HISTORY: No LMP recorded. (Menstrual status: IUD). Contraception:IUD Menopausal hormone therapy: none         OB History    Gravida  1   Para  0   Term      Preterm  0   AB  1   Living        SAB  1   TAB      Ectopic      Multiple      Live Births                 Patient Active Problem List   Diagnosis Date Noted  . Anxiety and depression 09/17/2015  . Obesity, Class II, BMI 35-39.9 09/17/2015    Past Medical History:  Diagnosis Date  . Anemia   . Anxiety   . Depression   . Endometriosis   . Ovarian cyst     Past Surgical History:  Procedure Laterality Date  . INTRAUTERINE DEVICE INSERTION  12/2015    Current Outpatient Medications  Medication Sig Dispense Refill  . buPROPion (WELLBUTRIN XL) 150 MG 24 hr tablet Take 150 mg by mouth.    Marland Kitchen ibuprofen (ADVIL) 800 MG tablet Take 1 tablet (800 mg total) by mouth every 8 (eight) hours as needed for moderate pain. 30 tablet 0  . levonorgestrel (MIRENA) 20 MCG/24HR IUD by Intrauterine route.    Marland Kitchen LORazepam (ATIVAN) 0.5 MG tablet Take 0.5 mg by mouth as needed.    Marland Kitchen omeprazole (PRILOSEC) 20 MG capsule Take by mouth.     No current facility-administered medications for this visit.     ALLERGIES: Sulfa antibiotics, Doxycycline, Latex, and Tape  Family History  Problem Relation Age of Onset  . Hypertension Sister   .  Diabetes Paternal Grandfather   . Hypertension Paternal Grandfather   . Hypertension Father     Social History   Socioeconomic History  . Marital status: Married    Spouse name: Not on file  . Number of children: Not on file  . Years of education: Not on file  . Highest education level: Not on file  Occupational History  . Not on file  Tobacco Use  . Smoking status: Never Smoker  . Smokeless tobacco: Never Used  Substance and Sexual Activity  . Alcohol use: Yes    Alcohol/week: 3.0 standard drinks    Types: 3 Standard drinks or equivalent per week  . Drug use: No  . Sexual activity: Yes    Partners: Male    Birth control/protection: I.U.D.  Other Topics Concern  . Not on file  Social History Narrative   2/17: she graduated from college in 1/17 with a degree in psychology. She is looking for work and plans to go back to school and get her masters in psychology  Social Determinants of Health   Financial Resource Strain:   . Difficulty of Paying Living Expenses: Not on file  Food Insecurity:   . Worried About Programme researcher, broadcasting/film/video in the Last Year: Not on file  . Ran Out of Food in the Last Year: Not on file  Transportation Needs:   . Lack of Transportation (Medical): Not on file  . Lack of Transportation (Non-Medical): Not on file  Physical Activity:   . Days of Exercise per Week: Not on file  . Minutes of Exercise per Session: Not on file  Stress:   . Feeling of Stress : Not on file  Social Connections:   . Frequency of Communication with Friends and Family: Not on file  . Frequency of Social Gatherings with Friends and Family: Not on file  . Attends Religious Services: Not on file  . Active Member of Clubs or Organizations: Not on file  . Attends Banker Meetings: Not on file  . Marital Status: Not on file  Intimate Partner Violence:   . Fear of Current or Ex-Partner: Not on file  . Emotionally Abused: Not on file  . Physically Abused: Not on file   . Sexually Abused: Not on file    Review of Systems  All other systems reviewed and are negative.   PHYSICAL EXAMINATION:    BP 118/64   Pulse 97   Temp 98.4 F (36.9 C)   Ht 5\' 1"  (1.549 m)   Wt 217 lb (98.4 kg)   SpO2 98%   BMI 41.00 kg/m     General appearance: alert, cooperative and appears stated age  Pelvic: External genitalia:  no lesions              Urethra:  normal appearing urethra with no masses, tenderness or lesions              Bartholins and Skenes: normal                 Vagina: normal appearing vagina with normal color and discharge, no lesions              Cervix:no lesions, IUD string 3 cm. IUD removed with ringed forceps.   Chaperone was present for exam.  ASSESSMENT Preconception counseling IUD removed Needs f/u lab work today Anxiety, just started on Wellbutrin and lorazepam     PLAN She is on PNV Discussed preconception care, information given She is immune to rubella Discussed medication for anxiety and depression in pregnancy. Reviewed that The typical treatment for depression and anxiety in pregnancy is SSRI's Reviewed the information on use of Wellbutrin in Pregnancy from up to date with the patient. I don't feel she needs to stop the Wellbutrin if it is helping her (too soon to tell) Recommended she stop lorazepam    An After Visit Summary was printed and given to the patient.  In addition to removal of the IUD, ~20 minutes was spent in direct patient care.

## 2020-01-21 NOTE — Patient Instructions (Signed)
Common Medications Safe in Pregnancy  Acne:      Constipation:  Benzoyl Peroxide     Colace  Clindamycin      Dulcolax Suppository  Topica Erythromycin     Fibercon  Salicylic Acid      Metamucil         Miralax AVOID:        Senakot   Accutane    Cough:  Retin-A       Cough Drops  Tetracycline      Phenergan w/ Codeine if Rx  Minocycline      Robitussin (Plain & DM)  Antibiotics:     Crabs/Lice:  Ceclor       RID  Cephalosporins    AVOID:  E-Mycins      Kwell  Keflex  Macrobid/Macrodantin   Diarrhea:  Penicillin      Kao-Pectate  Zithromax      Imodium AD         PUSH FLUIDS AVOID:       Cipro     Fever:  Tetracycline      Tylenol (Regular or Extra  Minocycline       Strength)  Levaquin      Extra Strength-Do not          Exceed 8 tabs/24 hrs Caffeine:        <200mg/day (equiv. To 1 cup of coffee or  approx. 3 12 oz sodas)         Gas: Cold/Hayfever:       Gas-X  Benadryl      Mylicon  Claritin       Phazyme  **Claritin-D        Chlor-Trimeton    Headaches:  Dimetapp      ASA-Free Excedrin  Drixoral-Non-Drowsy     Cold Compress  Mucinex (Guaifenasin)     Tylenol (Regular or Extra  Sudafed/Sudafed-12 Hour     Strength)  **Sudafed PE Pseudoephedrine   Tylenol Cold & Sinus     Vicks Vapor Rub  Zyrtec  **AVOID if Problems With Blood Pressure         Heartburn: Avoid lying down for at least 1 hour after meals  Aciphex      Maalox     Rash:  Milk of Magnesia     Benadryl    Mylanta       1% Hydrocortisone Cream  Pepcid  Pepcid Complete   Sleep Aids:  Prevacid      Ambien   Prilosec       Benadryl  Rolaids       Chamomile Tea  Tums (Limit 4/day)     Unisom  Zantac       Tylenol PM         Warm milk-add vanilla or  Hemorrhoids:       Sugar for taste  Anusol/Anusol H.C.  (RX: Analapram 2.5%)  Sugar Substitutes:  Hydrocortisone OTC     Ok in moderation  Preparation H      Tucks        Vaseline lotion applied to tissue with  wiping    Herpes:     Throat:  Acyclovir      Oragel  Famvir  Valtrex     Vaccines:         Flu Shot Leg Cramps:       *Gardasil  Benadryl      Hepatitis A         Hepatitis B Nasal Spray:         Pneumovax  Saline Nasal Spray     Polio Booster         Tetanus Nausea:       Tuberculosis test or PPD  Vitamin B6 25 mg TID   AVOID:    Dramamine      *Gardasil  Emetrol       Live Poliovirus  Ginger Root 250 mg QID    MMR (measles, mumps &  High Complex Carbs @ Bedtime    rebella)  Sea Bands-Accupressure    Varicella (Chickenpox)  Unisom 1/2 tab TID     *No known complications           If received before Pain:         Known pregnancy;   Darvocet       Resume series after  Lortab        Delivery  Percocet    Yeast:   Tramadol      Femstat  Tylenol 3      Gyne-lotrimin  Ultram       Monistat  Vicodin           MISC:         All Sunscreens           Hair Coloring/highlights          Insect Repellant's          (Including DEET)         Mystic Tans Commonly Asked Questions During Pregnancy  Cats: A parasite can be excreted in cat feces.  To avoid exposure you need to have another person empty the little box.  If you must empty the litter box you will need to wear gloves.  Wash your hands after handling your cat.  This parasite can also be found in raw or undercooked meat so this should also be avoided.  Colds, Sore Throats, Flu: Please check your medication sheet to see what you can take for symptoms.  If your symptoms are unrelieved by these medications please call the office.  Dental Work: Most any dental work your dentist recommends is permitted.  X-rays should only be taken during the first trimester if absolutely necessary.  Your abdomen should be shielded with a lead apron during all x-rays.  Please notify your provider prior to receiving any x-rays.  Novocaine is fine; gas is not recommended.  If your dentist requires a note from us prior to dental work please call the office  and we will provide one for you.  Exercise: Exercise is an important part of staying healthy during your pregnancy.  You may continue most exercises you were accustomed to prior to pregnancy.  Later in your pregnancy you will most likely notice you have difficulty with activities requiring balance like riding a bicycle.  It is important that you listen to your body and avoid activities that put you at a higher risk of falling.  Adequate rest and staying well hydrated are a must!  If you have questions about the safety of specific activities ask your provider.    Exposure to Children with illness: Try to avoid obvious exposure; report any symptoms to us when noted,  If you have chicken pos, red measles or mumps, you should be immune to these diseases.   Please do not take any vaccines while pregnant unless you have checked with your OB provider.  Fetal Movement: After 28 weeks we recommend you do "kick counts" twice daily.  Lie or sit down in a calm   quiet environment and count your baby movements "kicks".  You should feel your baby at least 10 times per hour.  If you have not felt 10 kicks within the first hour get up, walk around and have something sweet to eat or drink then repeat for an additional hour.  If count remains less than 10 per hour notify your provider.  Fumigating: Follow your pest control agent's advice as to how long to stay out of your home.  Ventilate the area well before re-entering.  Hemorrhoids:   Most over-the-counter preparations can be used during pregnancy.  Check your medication to see what is safe to use.  It is important to use a stool softener or fiber in your diet and to drink lots of liquids.  If hemorrhoids seem to be getting worse please call the office.   Hot Tubs:  Hot tubs Jacuzzis and saunas are not recommended while pregnant.  These increase your internal body temperature and should be avoided.  Intercourse:  Sexual intercourse is safe during pregnancy as long as  you are comfortable, unless otherwise advised by your provider.  Spotting may occur after intercourse; report any bright red bleeding that is heavier than spotting.  Labor:  If you know that you are in labor, please go to the hospital.  If you are unsure, please call the office and let us help you decide what to do.  Lifting, straining, etc:  If your job requires heavy lifting or straining please check with your provider for any limitations.  Generally, you should not lift items heavier than that you can lift simply with your hands and arms (no back muscles)  Painting:  Paint fumes do not harm your pregnancy, but may make you ill and should be avoided if possible.  Latex or water based paints have less odor than oils.  Use adequate ventilation while painting.  Permanents & Hair Color:  Chemicals in hair dyes are not recommended as they cause increase hair dryness which can increase hair loss during pregnancy.  " Highlighting" and permanents are allowed.  Dye may be absorbed differently and permanents may not hold as well during pregnancy.  Sunbathing:  Use a sunscreen, as skin burns easily during pregnancy.  Drink plenty of fluids; avoid over heating.  Tanning Beds:  Because their possible side effects are still unknown, tanning beds are not recommended.  Ultrasound Scans:  Routine ultrasounds are performed at approximately 20 weeks.  You will be able to see your baby's general anatomy an if you would like to know the gender this can usually be determined as well.  If it is questionable when you conceived you may also receive an ultrasound early in your pregnancy for dating purposes.  Otherwise ultrasound exams are not routinely performed unless there is a medical necessity.  Although you can request a scan we ask that you pay for it when conducted because insurance does not cover " patient request" scans.  Work: If your pregnancy proceeds without complications you may work until your due date,  unless your physician or employer advises otherwise.  Round Ligament Pain/Pelvic Discomfort:  Sharp, shooting pains not associated with bleeding are fairly common, usually occurring in the second trimester of pregnancy.  They tend to be worse when standing up or when you remain standing for long periods of time.  These are the result of pressure of certain pelvic ligaments called "round ligaments".  Rest, Tylenol and heat seem to be the most effective relief.  As the  womb and fetus grow, they rise out of the pelvis and the discomfort improves.  Please notify the office if your pain seems different than that described.  It may represent a more serious condition.

## 2020-01-22 ENCOUNTER — Encounter: Payer: Self-pay | Admitting: Obstetrics and Gynecology

## 2020-01-22 LAB — LIPID PANEL
Chol/HDL Ratio: 2.9 ratio (ref 0.0–4.4)
Cholesterol, Total: 203 mg/dL — ABNORMAL HIGH (ref 100–199)
HDL: 70 mg/dL (ref >39)
LDL Chol Calc (NIH): 121 mg/dL — ABNORMAL HIGH (ref 0–99)
Triglycerides: 68 mg/dL (ref 0–149)
VLDL Cholesterol Cal: 12 mg/dL (ref 5–40)

## 2020-01-22 LAB — VITAMIN D 25 HYDROXY (VIT D DEFICIENCY, FRACTURES): Vit D, 25-Hydroxy: 18.2 ng/mL — ABNORMAL LOW (ref 30.0–100.0)

## 2020-01-26 ENCOUNTER — Telehealth: Payer: Self-pay

## 2020-01-26 NOTE — Telephone Encounter (Signed)
Called and left message at number on DPR for Call back to go over lab results.

## 2020-02-02 ENCOUNTER — Telehealth: Payer: Self-pay

## 2020-02-02 DIAGNOSIS — E559 Vitamin D deficiency, unspecified: Secondary | ICD-10-CM

## 2020-02-02 MED ORDER — VITAMIN D (ERGOCALCIFEROL) 1.25 MG (50000 UNIT) PO CAPS: 50000.0000 [IU] | ORAL_CAPSULE | ORAL | 0 refills | Status: DC

## 2020-02-02 NOTE — Telephone Encounter (Signed)
Spoke to patient about Vitamin D. She states that she has been taking there 2000 IU daily. Sending Vitamin D script to [facility] pharmacy for 50,000 IU vitamin d. Will repeat lab in 12 weeks.

## 2020-07-02 ENCOUNTER — Ambulatory Visit: Payer: Self-pay

## 2020-08-02 ENCOUNTER — Other Ambulatory Visit: Payer: Self-pay

## 2020-08-02 ENCOUNTER — Ambulatory Visit
Admission: EM | Admit: 2020-08-02 | Discharge: 2020-08-02 | Disposition: A | Discharge status: Home / Self Care | Payer: Managed Care, Other (non HMO) | Attending: Family Medicine | Admitting: Family Medicine

## 2020-08-02 DIAGNOSIS — N200 Calculus of kidney: Secondary | ICD-10-CM | POA: Diagnosis present

## 2020-08-02 DIAGNOSIS — R002 Palpitations: Secondary | ICD-10-CM | POA: Diagnosis not present

## 2020-08-02 LAB — URINALYSIS, COMPLETE (UACMP) WITH MICROSCOPIC
Bilirubin Urine: NEGATIVE
Glucose, UA: NEGATIVE mg/dL
Ketones, ur: NEGATIVE mg/dL
Leukocytes,Ua: NEGATIVE
Nitrite: NEGATIVE
Protein, ur: 30 mg/dL — AB
RBC / HPF: >50 RBC/hpf (ref 0–5)
Specific Gravity, Urine: 1.025 (ref 1.005–1.030)
pH: 7 (ref 5.0–8.0)

## 2020-08-02 MED ORDER — HYDROCODONE-ACETAMINOPHEN 5-325 MG PO TABS: 1.0000 | ORAL_TABLET | Freq: Three times a day (TID) | ORAL | 0 refills | Status: DC | PRN

## 2020-08-02 NOTE — Discharge Instructions (Signed)
Lots of fluids.  Medication as directed.  If you worsen, go to the ER.  Take care  Dr. Adriana Simas

## 2020-08-02 NOTE — ED Triage Notes (Signed)
Patient states that she was feeling bad on Wednesday last week and started to notice low back pain on Friday. States that the pain worsened today and she was concerned for Kidney related issues.   Patient states that she started noticing chest fluttering on Wednesday and states that this comes and goes but has happen every day. Patient states that she had negative covid testing on Friday.

## 2020-08-03 ENCOUNTER — Telehealth: Payer: Self-pay | Admitting: Family Medicine

## 2020-08-03 MED ORDER — FLUCONAZOLE 150 MG PO TABS: 150.0000 mg | ORAL_TABLET | Freq: Once | ORAL | 0 refills | Status: AC

## 2020-08-03 NOTE — ED Provider Notes (Signed)
[facility]    CSN: [id number] Arrival date & time: 08/02/20  1826      History   Chief Complaint Chief Complaint  Patient presents with  . Back Pain   HPI  29 year old female presents with back pain.  Patient reports that last week she was fatigued.  She had Covid testing which was negative.  Subsequently developed right low back pain on Friday.  Pain resolved on Saturday but then recurred again yesterday.  Patient has a history of kidney stones and is concerned that this may be the cause.  Pain currently 3/10 in severity.  No relieving factors.  Denies hematuria.  No urinary symptoms.  No other complaints.  Past Medical History:  Diagnosis Date  . Anemia   . Anxiety   . Depression   . Endometriosis   . Ovarian cyst     Patient Active Problem List   Diagnosis Date Noted  . Anxiety and depression 09/17/2015  . Obesity, Class II, BMI 35-39.9 09/17/2015    Past Surgical History:  Procedure Laterality Date  . INTRAUTERINE DEVICE INSERTION  12/2015    OB History    Gravida  1   Para  0   Term      Preterm  0   AB  1   Living        SAB  1   TAB      Ectopic      Multiple      Live Births               Home Medications    Prior to Admission medications   Medication Sig Start Date End Date Taking? Authorizing Provider  ibuprofen (ADVIL) 800 MG tablet Take 1 tablet (800 mg total) by mouth every 8 (eight) hours as needed for moderate pain. 12/28/19  Yes Ares Cardozo G, DO  omeprazole (PRILOSEC) 20 MG capsule Take by mouth. 02/25/19 08/02/20 Yes [provider]  Prenatal Vit-Fe Fumarate-FA (PRENATAL MULTIVITAMIN) TABS tablet Take 1 tablet by mouth daily at 12 noon.   Yes [provider]  Vitamin D, Ergocalciferol, (DRISDOL) 1.25 MG (50000 UNIT) CAPS capsule Take 1 capsule (50,000 Units total) by mouth every 7 (seven) days. 02/02/20  Yes Romualdo Bolk, MD  buPROPion (WELLBUTRIN XL) 150 MG 24 hr tablet Take 300 mg by mouth.   01/15/20   [provider]  fluconazole (DIFLUCAN) 150 MG tablet Take 1 tablet (150 mg total) by mouth once for 1 dose. Repeat dose in 72 hours. 08/03/20 08/03/20  Tommie Sams, DO  HYDROcodone-acetaminophen (NORCO/VICODIN) 5-325 MG tablet Take 1 tablet by mouth every 8 (eight) hours as needed. 08/02/20   Tommie Sams, DO  levonorgestrel (MIRENA) 20 MCG/24HR IUD by Intrauterine route.  08/02/20  [provider]  phentermine (ADIPEX-P) 37.5 MG tablet Take by mouth. 06/02/19 12/28/19  [provider]    Family History Family History  Problem Relation Age of Onset  . Hypertension Sister   . Diabetes Paternal Grandfather   . Hypertension Paternal Grandfather   . Hypertension Father     Social History Social History   Tobacco Use  . Smoking status: Never Smoker  . Smokeless tobacco: Never Used  Vaping Use  . Vaping Use: Never used  Substance Use Topics  . Alcohol use: Yes    Alcohol/week: 3.0 standard drinks    Types: 3 Standard drinks or equivalent per week  . Drug use: No  Allergies   Sulfa antibiotics, Doxycycline, Latex, and Tape   Review of Systems Review of Systems  Constitutional: Negative.   Musculoskeletal:       Back pain/flank pain.   Physical Exam Triage Vital Signs ED Triage Vitals  Enc Vitals Group     BP 08/02/20 1852 137/87     Pulse Rate 08/02/20 1852 84     Resp 08/02/20 1852 17     Temp 08/02/20 1852 98.5 F (36.9 C)     Temp Source 08/02/20 1852 Oral     SpO2 08/02/20 1852 99 %     Weight 08/02/20 1850 216 lb (98 kg)     Height 08/02/20 1850 5\' 1"  (1.549 m)     Head Circumference --      Peak Flow --      Pain Score 08/02/20 1850 3     Pain Loc --      Pain Edu? --      Excl. in GC? --    Updated Vital Signs BP 137/87 (BP Location: Left Arm)   Pulse 84   Temp 98.5 F (36.9 C) (Oral)   Resp 17   Ht 5\' 1"  (1.549 m)   Wt 98 kg   LMP 07/22/2020   SpO2 99%   BMI 40.81 kg/m   Visual Acuity Right Eye Distance:     Left Eye Distance:   Bilateral Distance:    Right Eye Near:   Left Eye Near:    Bilateral Near:     Physical Exam Vitals and nursing note reviewed.  Constitutional:      General: She is not in acute distress.    Appearance: Normal appearance. She is not ill-appearing.  HENT:     Head: Normocephalic and atraumatic.  Eyes:     General:        Right eye: No discharge.        Left eye: No discharge.     Conjunctiva/sclera: Conjunctivae normal.  Cardiovascular:     Rate and Rhythm: Normal rate and regular rhythm.  Pulmonary:     Effort: Pulmonary effort is normal.     Breath sounds: Normal breath sounds. No wheezing or rales.  Abdominal:     Tenderness: There is no right CVA tenderness or left CVA tenderness.  Neurological:     Mental Status: She is alert.  Psychiatric:        Mood and Affect: Mood normal.        Behavior: Behavior normal.    UC Treatments / Results  Labs (all labs ordered are listed, but only abnormal results are displayed) Labs Reviewed  URINALYSIS, COMPLETE (UACMP) WITH MICROSCOPIC - Abnormal; Notable for the following components:      Result Value   APPearance HAZY (*)    Hgb urine dipstick LARGE (*)    Protein, ur 30 (*)    Bacteria, UA FEW (*)    All other components within normal limits    EKG   Radiology No results found.  Procedures Procedures (including critical care time)  Medications Ordered in UC Medications - No data to display  Initial Impression / Assessment and Plan / UC Course  I have reviewed the triage vital signs and the nursing notes.  Pertinent labs & imaging results that were available during my care of the patient were reviewed by me and considered in my medical decision making (see chart for details).    29 year old female presents with back. UA with hematuria  and yeast. Concern for stone given prior history and current back pain with hematuria. Treating with increased fluids, Norco as needed, diflucan. Cannot use  flomax due to allergies. We discussed CT and patient elected to wait. Return precautions given.  Final Clinical Impressions(s) / UC Diagnoses   Final diagnoses:  Kidney stone     Discharge Instructions     Lots of fluids.  Medication as directed.  If you worsen, go to the ER.  Take care  Dr. Adriana Simas     ED Prescriptions    Medication Sig Dispense Auth. Provider   HYDROcodone-acetaminophen (NORCO/VICODIN) 5-325 MG tablet Take 1 tablet by mouth every 8 (eight) hours as needed. 10 tablet Everlene Other G, DO     I have reviewed the PDMP during this encounter.   Tommie Sams, [place] 08/03/20 2152

## 2020-08-03 NOTE — Telephone Encounter (Signed)
Rx sent for diflucan. Forgot to sent at visit.

## 2020-08-09 ENCOUNTER — Telehealth: Payer: Self-pay | Admitting: Obstetrics and Gynecology

## 2020-08-09 NOTE — Telephone Encounter (Signed)
Spoke with patient, advised per Dr. Oscar La. Patient agreeable. OV scheduled for 8/17 at 8am with Dr. Oscar La.   Encounter closed.

## 2020-08-09 NOTE — Telephone Encounter (Signed)
Patient is having problems with "ovarain cysts" and would like to discuss.

## 2020-08-09 NOTE — Telephone Encounter (Signed)
I would recommend that she come in for an office visit.

## 2020-08-09 NOTE — Telephone Encounter (Signed)
Spoke with patient. IUD removed 12/2019 for pregnancy planning. Patient reports menses have been irregular since IUD removed, has one menses monthly, varies in length. LMP 07/22/20 -07/25/20. Reports discomfort and pressure following menses each month. Hx of ovarian cyst and endometriosis per patient. Has been doing OPK, ovulation pattern is different monthly. Was seen at urgent care on 8/9 for right low back pain and hematuria, tx for possible kidney stone and yeast.   Patient denies any current symptoms today. No recent imaging.  Advised patient PUS may be recommended, will review with Dr. Oscar La and return call to schedule. Patient agreeable.   Last AEX 10/15/19  Dr. Oscar La -please review. OV or PUS?

## 2020-08-10 ENCOUNTER — Other Ambulatory Visit: Payer: Self-pay

## 2020-08-10 ENCOUNTER — Encounter: Payer: Self-pay | Admitting: Obstetrics and Gynecology

## 2020-08-10 ENCOUNTER — Ambulatory Visit: Payer: Managed Care, Other (non HMO) | Admitting: Obstetrics and Gynecology

## 2020-08-10 VITALS — BP 110/74 | HR 87 | Ht 61.0 in | Wt 218.0 lb

## 2020-08-10 DIAGNOSIS — E559 Vitamin D deficiency, unspecified: Secondary | ICD-10-CM | POA: Diagnosis not present

## 2020-08-10 DIAGNOSIS — Z319 Encounter for procreative management, unspecified: Secondary | ICD-10-CM

## 2020-08-10 DIAGNOSIS — N926 Irregular menstruation, unspecified: Secondary | ICD-10-CM | POA: Diagnosis not present

## 2020-08-10 DIAGNOSIS — Z87442 Personal history of urinary calculi: Secondary | ICD-10-CM

## 2020-08-10 DIAGNOSIS — N940 Mittelschmerz: Secondary | ICD-10-CM

## 2020-08-10 LAB — POCT URINE PREGNANCY: Preg Test, Ur: NEGATIVE

## 2020-08-10 NOTE — Progress Notes (Signed)
GYNECOLOGY  VISIT   HPI: 29 y.o.   Married White or Caucasian Not Hispanic or Latino  female   G1P0010 with Patient's last menstrual period was 07/22/2020.   here for follow up from urgent care. Patient was seen in urgent care on 8/9 from right lower back pain and hematuria. They didn't image her or do blood work, told her that she had a kidney stones.  She believes she passed a kidney stone at the end of last week, painful void, passed small amount of blood. She has seen a Insurance underwriter in the past.  She is here because of abdominal pain. She had her IUD out in January, trying to get pregnant. Her baseline cycles prior to birth control was irregular, would bleed frequently, no skipped months. Since the IUD was removed her cycles have been every 23-32 days x 3-10 days. She can saturate a super tampon in up to 3 hour. She is have pain for a few days prior to her cycle, cramps with her cycle are mild.  She is then having pain pain in the RLQ 1-2 weeks after her cycle ends, that last for 2-3 days. Sometimes the pain is severe, comes and goes over the 2-3 days. Not currently having the pain.  She tried ovulation predictor kits for 2 months and was ovulating, was off from the prediction of the app by a few days.   GYNECOLOGIC HISTORY: Patient's last menstrual period was 07/22/2020. Contraception: none  Menopausal hormone therapy: none         OB History    Gravida  1   Para  0   Term      Preterm  0   AB  1   Living        SAB  1   TAB      Ectopic      Multiple      Live Births                 Patient Active Problem List   Diagnosis Date Noted   Anxiety and depression 09/17/2015   Obesity, Class II, BMI 35-39.9 09/17/2015    Past Medical History:  Diagnosis Date   Anemia    Anxiety    Depression    Endometriosis    Ovarian cyst     Past Surgical History:  Procedure Laterality Date   INTRAUTERINE DEVICE INSERTION  12/2015    Current Outpatient  Medications  Medication Sig Dispense Refill   buPROPion (WELLBUTRIN XL) 150 MG 24 hr tablet Take 300 mg by mouth.      HYDROcodone-acetaminophen (NORCO/VICODIN) 5-325 MG tablet Take 1 tablet by mouth every 8 (eight) hours as needed. 10 tablet 0   ibuprofen (ADVIL) 800 MG tablet Take 1 tablet (800 mg total) by mouth every 8 (eight) hours as needed for moderate pain. 30 tablet 0   omeprazole (PRILOSEC) 20 MG capsule Take by mouth.     Prenatal Vit-Fe Fumarate-FA (PRENATAL MULTIVITAMIN) TABS tablet Take 1 tablet by mouth daily at 12 noon.     Vitamin D, Ergocalciferol, (DRISDOL) 1.25 MG (50000 UNIT) CAPS capsule Take 1 capsule (50,000 Units total) by mouth every 7 (seven) days. 12 capsule 0   No current facility-administered medications for this visit.     ALLERGIES: Sulfa antibiotics, Doxycycline, Latex, and Tape  Family History  Problem Relation Age of Onset   Hypertension Sister    Diabetes Paternal Grandfather    Hypertension Paternal Grandfather    Hypertension  Father     Social History   Socioeconomic History   Marital status: Married    Spouse name: Not on file   Number of children: Not on file   Years of education: Not on file   Highest education level: Not on file  Occupational History   Not on file  Tobacco Use   Smoking status: Never Smoker   Smokeless tobacco: Never Used  Vaping Use   Vaping Use: Never used  Substance and Sexual Activity   Alcohol use: Yes    Alcohol/week: 3.0 standard drinks    Types: 3 Standard drinks or equivalent per week   Drug use: No   Sexual activity: Yes    Partners: Male    Birth control/protection: I.U.D.  Other Topics Concern   Not on file  Social History Narrative   2/17: she graduated from college in 1/17 with a degree in psychology. She is looking for work and plans to go back to school and get her masters in psychology   Social Determinants of Health   Financial Resource Strain:    Difficulty of  Paying Living Expenses:   Food Insecurity:    Worried About Programme researcher, broadcasting/film/video in the Last Year:    Barista in the Last Year:   Transportation Needs:    Freight forwarder (Medical):    Lack of Transportation (Non-Medical):   Physical Activity:    Days of Exercise per Week:    Minutes of Exercise per Session:   Stress:    Feeling of Stress :   Social Connections:    Frequency of Communication with Friends and Family:    Frequency of Social Gatherings with Friends and Family:    Attends Religious Services:    Active Member of Clubs or Organizations:    Attends Engineer, structural:    Marital Status:   Intimate Partner Violence:    Fear of Current or Ex-Partner:    Emotionally Abused:    Physically Abused:    Sexually Abused:     Review of Systems  Gastrointestinal: Positive for abdominal pain.  All other systems reviewed and are negative.   PHYSICAL EXAMINATION:    LMP 07/22/2020     General appearance: alert, cooperative and appears stated age Neck: no adenopathy, supple, symmetrical, trachea midline and thyroid normal to inspection and palpation Abdomen: soft, mildly tender in the RLQ, no rebound, no guarding, non distended, no masses,  no organomegaly Pelvic: deferred  ASSESSMENT Irregular cycles Mid cycle pain, suspect ovulatory pain.  H/O anemia with her cycles prior to contraception, not to that level of heaviness.  H/o vit d def, on vit d Recent kidney stone, previously evaluated    PLAN TSH, vit D level UPT negative. Start BBT charts, calendar pain as well.  F/U in 2 months Call with severe pain, take ibuprofen as needed Continue PNV Information on kidney stones from Up To Date given    30 minutes in total patient care

## 2020-08-11 ENCOUNTER — Telehealth: Payer: Self-pay

## 2020-08-11 LAB — TSH: TSH: 2.82 u[IU]/mL (ref 0.450–4.500)

## 2020-08-11 LAB — VITAMIN D 25 HYDROXY (VIT D DEFICIENCY, FRACTURES): Vit D, 25-Hydroxy: 20.3 ng/mL — ABNORMAL LOW (ref 30.0–100.0)

## 2020-08-11 NOTE — Telephone Encounter (Signed)
-----   Message from Romualdo Bolk, MD sent at 08/11/2020  3:40 PM EDT ----- Can you please call Nathasha and confirm the amount of vit d she is currently taking. She clearly has issues with absorption. Her TSH is normal.

## 2020-08-11 NOTE — Telephone Encounter (Signed)
Spoke with pt. Pt given results and recommendations per Dr Oscar La.  Pt states taking Vit D3 1000IU daily.   Routing to Dr Oscar La.

## 2020-08-15 NOTE — Telephone Encounter (Signed)
Please have her increase her vit d intake to 5,000 IU daily x 3 months, then repeat her level.

## 2020-08-16 NOTE — Telephone Encounter (Signed)
Left message for pt to return call to triage RN. 

## 2020-08-16 NOTE — Telephone Encounter (Signed)
Detailed message left per pt's request. Pt to return call to make lab appt for Vit D Level in 3 months.  Encounter closed.

## 2020-08-16 NOTE — Telephone Encounter (Signed)
Patient is returning call. Patient states it is okay to leave a detailed message due to patients work schedule she may not be able to answer.

## 2020-10-18 NOTE — Progress Notes (Signed)
29 y.o. G1P0010 Married White or Caucasian Not Hispanic or Latino female here for annual exam.  IUD removed last January. Trying to get pregnant. Prior ovulation on predictor kits. She did BBT charts the last 2 months. She had covid the first month. 2nd month no clear ovulation.  Period Cycle (Days): 28 Period Duration (Days): 3-7 Period Pattern: Regular Menstrual Flow: Heavy, Light Menstrual Control: Tampon, Panty liner Menstrual Control Change Freq (Hours): 3-4 Dysmenorrhea: (!) Moderate Dysmenorrhea Symptoms: Cramping, Headache  She is having intermittent RLQ abdominal/pelvic pain since March. The severe pain lasts for a minute and can be up to a 6/10 in severity. She also has an aching pain can last the whole day and is a 2/10 in severity. .  No bowel or bladder changes. Voids frequently, normal amounts. Intermittently having right sided pelvic pain with intercourse, positional  Patient's last menstrual period was 10/14/2020.          Sexually active: Yes.    The current method of family planning is none.    Exercising: Yes.    walking  Smoker:  no  Health Maintenance: Pap:  09/08/2015 WNL 10/15/19 normal  History of abnormal Pap:  no MMG:  None  BMD:   None  Colonoscopy: none  TDaP:  10/24/16  Gardasil: completed    reports that she has never smoked. She has never used smokeless tobacco. She reports current alcohol use of about 3.0 standard drinks of alcohol per week. She reports that she does not use drugs. 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   Past Medical History:  Diagnosis Date  . Anemia   . Anxiety   . Depression   . Endometriosis   . Ovarian cyst     Past Surgical History:  Procedure Laterality Date  . INTRAUTERINE DEVICE INSERTION  12/2015    Current Outpatient Medications  Medication Sig Dispense Refill  . cholecalciferol (VITAMIN D3) 25 MCG (1000 UNIT) tablet Take 1,000 Units by mouth daily.    Marland Kitchen ibuprofen (ADVIL) 800 MG tablet Take 1 tablet (800  mg total) by mouth every 8 (eight) hours as needed for moderate pain. 30 tablet 0  . Prenatal Vit-Fe Fumarate-FA (MULTIVITAMIN-PRENATAL) 27-0.8 MG TABS tablet Take 1 tablet by mouth daily at 12 noon.    . Vitamin D, Ergocalciferol, (DRISDOL) 1.25 MG (50000 UNIT) CAPS capsule Take 1 capsule (50,000 Units total) by mouth every 7 (seven) days. 12 capsule 0  . buPROPion (WELLBUTRIN XL) 150 MG 24 hr tablet Take 300 mg by mouth.      No current facility-administered medications for this visit.    Family History  Problem Relation Age of Onset  . Hypertension Sister   . Diabetes Paternal Grandfather   . Hypertension Paternal Grandfather   . Hypertension Father     Review of Systems  All other systems reviewed and are negative.   Exam:   BP 132/68   Pulse 87   Ht 5\' 1"  (1.549 m)   Wt 224 lb (101.6 kg)   LMP 10/14/2020   SpO2 99%   BMI 42.32 kg/m   Weight change: @WEIGHTCHANGE @ Height:   Height: 5\' 1"  (154.9 cm)  Ht Readings from Last 3 Encounters:  10/19/20 5\' 1"  (1.549 m)  08/10/20 5\' 1"  (1.549 m)  08/02/20 5\' 1"  (1.549 m)    General appearance: alert, cooperative and appears stated age Head: Normocephalic, without obvious abnormality, atraumatic Neck: no adenopathy, supple, symmetrical, trachea midline and thyroid normal to inspection and palpation Lungs:  clear to auscultation bilaterally Cardiovascular: regular rate and rhythm Breasts: normal appearance, no masses or tenderness Abdomen: soft, non-tender; non distended,  no masses,  no organomegaly Extremities: extremities normal, atraumatic, no cyanosis or edema Skin: Skin color, texture, turgor normal. No rashes or lesions Lymph nodes: Cervical, supraclavicular, and axillary nodes normal. No abnormal inguinal nodes palpated Neurologic: Grossly normal   Pelvic: External genitalia:  no lesions              Urethra:  normal appearing urethra with no masses, tenderness or lesions              Bartholins and Skenes: normal                  Vagina: normal appearing vagina with normal color and discharge, no lesions              Cervix: no cervical motion tenderness and no lesions               Bimanual Exam:  Uterus:  normal size, contour, position, consistency, mobility, non-tender              Adnexa: no masses, mildly tender on the right.                Rectovaginal: Confirms               Anus:  normal sphincter tone, no lesions  Shanon Petty chaperoned for the exam.  A:  Well Woman with normal exam  BMI 42, she reports fasting glucose with her primary was 41. She has been checking it at home and has mainly had fasting glucose level in the 80's, low of 66 at home.   Vit d def, taking 5,000 IU daily, no change in her vit d level. Went from 20 to 21 from 08/10/20 to 10/05/20  Pelvic pain (right sided)  P:   Increase vit d to 50,000 IU q week x 12 weeks, then return for vit d level.  Return for an ultrasound  Will continue BBT charts and will add OPK  Other labs UTD with primary  Discussed breast self exam  Discussed calcium and vit D intake  On PNV

## 2020-10-19 ENCOUNTER — Ambulatory Visit (INDEPENDENT_AMBULATORY_CARE_PROVIDER_SITE_OTHER): Payer: Managed Care, Other (non HMO) | Admitting: Obstetrics and Gynecology

## 2020-10-19 ENCOUNTER — Encounter: Payer: Self-pay | Admitting: Obstetrics and Gynecology

## 2020-10-19 ENCOUNTER — Other Ambulatory Visit: Payer: Self-pay

## 2020-10-19 VITALS — BP 132/68 | HR 87 | Ht 61.0 in | Wt 224.0 lb

## 2020-10-19 DIAGNOSIS — R102 Pelvic and perineal pain: Secondary | ICD-10-CM | POA: Diagnosis not present

## 2020-10-19 DIAGNOSIS — E559 Vitamin D deficiency, unspecified: Secondary | ICD-10-CM | POA: Diagnosis not present

## 2020-10-19 DIAGNOSIS — R109 Unspecified abdominal pain: Secondary | ICD-10-CM

## 2020-10-19 DIAGNOSIS — Z01419 Encounter for gynecological examination (general) (routine) without abnormal findings: Secondary | ICD-10-CM

## 2020-10-19 MED ORDER — VITAMIN D (ERGOCALCIFEROL) 1.25 MG (50000 UNIT) PO CAPS: 50000.0000 [IU] | ORAL_CAPSULE | ORAL | 0 refills | Status: DC

## 2020-10-19 NOTE — Patient Instructions (Signed)
EXERCISE AND DIET:  We recommended that you start or continue a regular exercise program for good health. Regular exercise means any activity that makes your heart beat faster and makes you sweat.  We recommend exercising at least 30 minutes per day at least 3 days a week, preferably 4 or 5.  We also recommend a diet low in fat and sugar.  Inactivity, poor dietary choices and obesity can cause diabetes, heart attack, stroke, and kidney damage, among others.    ALCOHOL AND SMOKING:  Women should limit their alcohol intake to no more than 7 drinks/beers/glasses of wine (combined, not each!) per week. Moderation of alcohol intake to this level decreases your risk of breast cancer and liver damage. And of course, no recreational drugs are part of a healthy lifestyle.  And absolutely no smoking or even second hand smoke. Most people know smoking can cause heart and lung diseases, but did you know it also contributes to weakening of your bones? Aging of your skin?  Yellowing of your teeth and nails?  CALCIUM AND VITAMIN D:  Adequate intake of calcium and Vitamin D are recommended.  The recommendations for exact amounts of these supplements seem to change often, but generally speaking 1,000 mg of calcium (between diet and supplement) and 800 units of Vitamin D per day seems prudent. Certain women may benefit from higher intake of Vitamin D.  If you are among these women, your doctor will have told you during your visit.    PAP SMEARS:  Pap smears, to check for cervical cancer or precancers,  have traditionally been done yearly, although recent scientific advances have shown that most women can have pap smears less often.  However, every woman still should have a physical exam from her gynecologist every year. It will include a breast check, inspection of the vulva and vagina to check for abnormal growths or skin changes, a visual exam of the cervix, and then an exam to evaluate the size and shape of the uterus and  ovaries.  And after 27 years of age, a rectal exam is indicated to check for rectal cancers. We will also provide age appropriate advice regarding health maintenance, like when you should have certain vaccines, screening for sexually transmitted diseases, bone density testing, colonoscopy, mammograms, etc.   MAMMOGRAMS:  All women over 40 years old should have a yearly mammogram. Many facilities now offer a "3D" mammogram, which may cost around $50 extra out of pocket. If possible,  we recommend you accept the option to have the 3D mammogram performed.  It both reduces the number of women who will be called back for extra views which then turn out to be normal, and it is better than the routine mammogram at detecting truly abnormal areas.    COLON CANCER SCREENING: Now recommend starting at age 45. At this time colonoscopy is not covered for routine screening until 50. There are take home tests that can be done between 45-49.   COLONOSCOPY:  Colonoscopy to screen for colon cancer is recommended for all women at age 50.  We know, you hate the idea of the prep.  We agree, BUT, having colon cancer and not knowing it is worse!!  Colon cancer so often starts as a polyp that can be seen and removed at colonscopy, which can quite literally save your life!  And if your first colonoscopy is normal and you have no family history of colon cancer, most women don't have to have it again for   10 years.  Once every ten years, you can do something that may end up saving your life, right?  We will be happy to help you get it scheduled when you are ready.  Be sure to check your insurance coverage so you understand how much it will cost.  It may be covered as a preventative service at no cost, but you should check your particular policy.      Breast Self-Awareness Breast self-awareness means being familiar with how your breasts look and feel. It involves checking your breasts regularly and reporting any changes to your  health care provider. Practicing breast self-awareness is important. A change in your breasts can be a sign of a serious medical problem. Being familiar with how your breasts look and feel allows you to find any problems early, when treatment is more likely to be successful. All women should practice breast self-awareness, including women who have had breast implants. How to do a breast self-exam One way to learn what is normal for your breasts and whether your breasts are changing is to do a breast self-exam. To do a breast self-exam: Look for Changes  1. Remove all the clothing above your waist. 2. Stand in front of a mirror in a room with good lighting. 3. Put your hands on your hips. 4. Push your hands firmly downward. 5. Compare your breasts in the mirror. Look for differences between them (asymmetry), such as: ? Differences in shape. ? Differences in size. ? Puckers, dips, and bumps in one breast and not the other. 6. Look at each breast for changes in your skin, such as: ? Redness. ? Scaly areas. 7. Look for changes in your nipples, such as: ? Discharge. ? Bleeding. ? Dimpling. ? Redness. ? A change in position. Feel for Changes Carefully feel your breasts for lumps and changes. It is best to do this while lying on your back on the floor and again while sitting or standing in the shower or tub with soapy water on your skin. Feel each breast in the following way:  Place the arm on the side of the breast you are examining above your head.  Feel your breast with the other hand.  Start in the nipple area and make  inch (2 cm) overlapping circles to feel your breast. Use the pads of your three middle fingers to do this. Apply light pressure, then medium pressure, then firm pressure. The light pressure will allow you to feel the tissue closest to the skin. The medium pressure will allow you to feel the tissue that is a little deeper. The firm pressure will allow you to feel the tissue  close to the ribs.  Continue the overlapping circles, moving downward over the breast until you feel your ribs below your breast.  Move one finger-width toward the center of the body. Continue to use the  inch (2 cm) overlapping circles to feel your breast as you move slowly up toward your collarbone.  Continue the up and down exam using all three pressures until you reach your armpit.  Write Down What You Find  Write down what is normal for each breast and any changes that you find. Keep a written record with breast changes or normal findings for each breast. By writing this information down, you do not need to depend only on memory for size, tenderness, or location. Write down where you are in your menstrual cycle, if you are still menstruating. If you are having trouble noticing differences   in your breasts, do not get discouraged. With time you will become more familiar with the variations in your breasts and more comfortable with the exam. How often should I examine my breasts? Examine your breasts every month. If you are breastfeeding, the best time to examine your breasts is after a feeding or after using a breast pump. If you menstruate, the best time to examine your breasts is 5-7 days after your period is over. During your period, your breasts are lumpier, and it may be more difficult to notice changes. When should I see my health care provider? See your health care provider if you notice:  A change in shape or size of your breasts or nipples.  A change in the skin of your breast or nipples, such as a reddened or scaly area.  Unusual discharge from your nipples.  A lump or thick area that was not there before.  Pain in your breasts.  Anything that concerns you.  

## 2020-10-20 ENCOUNTER — Encounter: Payer: Self-pay | Admitting: Obstetrics and Gynecology

## 2020-10-20 ENCOUNTER — Telehealth: Payer: Self-pay

## 2020-10-20 NOTE — Telephone Encounter (Signed)
Spoke with patient regarding benefits for recommended ultrasound. Patient is aware that ultrasound is transvaginal. Patient acknowledges understanding of information presented. Patient is aware of cancellation policy. Patient scheduled appointment for 10/28/2020 at 0900AM with Gertie Exon, MD. Encounter closed.

## 2020-10-20 NOTE — Telephone Encounter (Signed)
Patient returned call. Please call work number [phone].

## 2020-10-20 NOTE — Telephone Encounter (Signed)
Call to patient. Per DPR, OK to leave message on voicemail.   Left voicemail requesting a return call to Tonya Hill to review benefits and schedule recommended Pelvic ultrasound with Jill Jertson, MD 

## 2020-10-28 ENCOUNTER — Encounter: Payer: Self-pay | Admitting: Obstetrics and Gynecology

## 2020-10-28 ENCOUNTER — Ambulatory Visit: Payer: Managed Care, Other (non HMO) | Admitting: Obstetrics and Gynecology

## 2020-10-28 ENCOUNTER — Other Ambulatory Visit: Payer: Self-pay

## 2020-10-28 ENCOUNTER — Ambulatory Visit (INDEPENDENT_AMBULATORY_CARE_PROVIDER_SITE_OTHER): Payer: Managed Care, Other (non HMO)

## 2020-10-28 VITALS — BP 132/68 | HR 98 | Ht 61.0 in | Wt 224.0 lb

## 2020-10-28 DIAGNOSIS — R109 Unspecified abdominal pain: Secondary | ICD-10-CM | POA: Diagnosis not present

## 2020-10-28 DIAGNOSIS — R102 Pelvic and perineal pain: Secondary | ICD-10-CM | POA: Diagnosis not present

## 2020-10-28 NOTE — Progress Notes (Signed)
GYNECOLOGY  VISIT   HPI: 29 y.o.   Married White or Caucasian Not Hispanic or Latino  female   [id number] with Patient's last menstrual period was 10/14/2020.   here for ultrasound consultation secondary to pelvic pain.   She has been trying to get pregnant for the last 10 months, no clear ovulation on 2 months of predictor kits.   GYNECOLOGIC HISTORY: Patient's last menstrual period was 10/14/2020. Contraception:none  Menopausal hormone therapy: none         OB History    Gravida  1   Para  0   Term      Preterm  0   AB  1   Living        SAB  1   TAB      Ectopic      Multiple      Live Births                 Patient Active Problem List   Diagnosis Date Noted  . Anxiety and depression 09/17/2015  . Obesity, Class II, BMI 35-39.9 09/17/2015    Past Medical History:  Diagnosis Date  . Anemia   . Anxiety   . Depression   . Endometriosis   . Ovarian cyst     Past Surgical History:  Procedure Laterality Date  . INTRAUTERINE DEVICE INSERTION  12/2015    Current Outpatient Medications  Medication Sig Dispense Refill  . buPROPion (WELLBUTRIN XL) 150 MG 24 hr tablet Take 300 mg by mouth.     . cholecalciferol (VITAMIN D3) 25 MCG (1000 UNIT) tablet Take 1,000 Units by mouth daily.    Marland Kitchen ibuprofen (ADVIL) 800 MG tablet Take 1 tablet (800 mg total) by mouth every 8 (eight) hours as needed for moderate pain. 30 tablet 0  . Prenatal Vit-Fe Fumarate-FA (MULTIVITAMIN-PRENATAL) 27-0.8 MG TABS tablet Take 1 tablet by mouth daily at 12 noon.    . Vitamin D, Ergocalciferol, (DRISDOL) 1.25 MG (50000 UNIT) CAPS capsule Take 1 capsule (50,000 Units total) by mouth every 7 (seven) days. 12 capsule 0   No current facility-administered medications for this visit.     ALLERGIES: Sulfa antibiotics, Doxycycline, Latex, and Tape  Family History  Problem Relation Age of Onset  . Hypertension Sister   . Diabetes Paternal Grandfather   . Hypertension Paternal Grandfather    . Hypertension Father     Social History   Socioeconomic History  . Marital status: Married    Spouse name: Not on file  . Number of children: Not on file  . Years of education: Not on file  . Highest education level: Not on file  Occupational History  . Not on file  Tobacco Use  . Smoking status: Never Smoker  . Smokeless tobacco: Never Used  Vaping Use  . Vaping Use: Never used  Substance and Sexual Activity  . Alcohol use: Yes    Alcohol/week: 3.0 standard drinks    Types: 3 Standard drinks or equivalent per week  . Drug use: No  . Sexual activity: Yes    Partners: Male    Birth control/protection: I.U.D.  Other Topics Concern  . Not on file  Social History Narrative   2/17: she graduated from college in 1/17 with a degree in psychology. She is looking for work and plans to go back to school and get her masters in psychology   Social Determinants of Health   Financial Resource Strain:   . Difficulty of Paying  Living Expenses: Not on file  Food Insecurity:   . Worried About Charity fundraiser in the Last Year: Not on file  . Ran Out of Food in the Last Year: Not on file  Transportation Needs:   . Lack of Transportation (Medical): Not on file  . Lack of Transportation (Non-Medical): Not on file  Physical Activity:   . Days of Exercise per Week: Not on file  . Minutes of Exercise per Session: Not on file  Stress:   . Feeling of Stress : Not on file  Social Connections:   . Frequency of Communication with Friends and Family: Not on file  . Frequency of Social Gatherings with Friends and Family: Not on file  . Attends Religious Services: Not on file  . Active Member of Clubs or Organizations: Not on file  . Attends Archivist Meetings: Not on file  . Marital Status: Not on file  Intimate Partner Violence:   . Fear of Current or Ex-Partner: Not on file  . Emotionally Abused: Not on file  . Physically Abused: Not on file  . Sexually Abused: Not on  file    Review of Systems  All other systems reviewed and are negative.   PHYSICAL EXAMINATION:    BP 132/68   Pulse 98   Ht $R'5\' 1"'zy$  (1.549 m)   Wt 224 lb (101.6 kg)   LMP 10/14/2020   SpO2 99%   BMI 42.32 kg/m     General appearance: alert, cooperative and appears stated age   Ultrasound images reviewed with the patient. Normal ultrasound, normal uterus, stripe and adnexa. Ovaries do not have PCOS appearance   ASSESSMENT Abdominal/pelvic pain, normal pelvic ultrasound Attempting pregnancy for 10 months, regular cycles but not clear ovulation on seen on 2 months of BBT charts (one month sick, other month didn't appear to ovulate)    PLAN F/U with primary for further evaluation of pain She is doing BBT charts and ovulation predictor kits, will f/u in 2 months Husband will do semen analysis prior to her f/u visit (consent has been signed, kit given)

## 2020-11-17 ENCOUNTER — Telehealth: Payer: Self-pay

## 2020-11-17 NOTE — Telephone Encounter (Signed)
Per fax from [facility], patient is scheduled with Dr. April Manson on 01/28/2021.  Routing to [place] and Dr. Oscar La as Lorain Childes.  Encounter closed.

## 2021-01-02 ENCOUNTER — Ambulatory Visit
Admission: EM | Admit: 2021-01-02 | Discharge: 2021-01-02 | Disposition: A | Discharge status: Home / Self Care | Payer: Managed Care, Other (non HMO) | Attending: Physician Assistant | Admitting: Physician Assistant

## 2021-01-02 ENCOUNTER — Other Ambulatory Visit: Payer: Self-pay

## 2021-01-02 ENCOUNTER — Encounter: Payer: Self-pay | Admitting: Emergency Medicine

## 2021-01-02 DIAGNOSIS — J029 Acute pharyngitis, unspecified: Secondary | ICD-10-CM | POA: Diagnosis not present

## 2021-01-02 DIAGNOSIS — U071 COVID-19: Secondary | ICD-10-CM | POA: Diagnosis not present

## 2021-01-02 LAB — GROUP A STREP BY PCR: Group A Strep by PCR: NOT DETECTED

## 2021-01-02 NOTE — Discharge Instructions (Signed)
Self isolate until covid results are back.  We will notify you by phone if it is positive. Your negative results will be sent through your MyChart.    If it is positive you need to isolate from others for a total of 5 days. If no fever for 24 hours without medications, and symptoms improving you may end isolation on day 6, but wear a mask if around any others for an additional 5 days.   Throat lozenges, gargles, chloraseptic spray, warm teas, popsicles etc to help with throat pain.   If symptoms worsen or do not improve in the next week to return to be seen or to follow up with your  PCP.

## 2021-01-02 NOTE — ED Triage Notes (Signed)
Patient c/o sore throat that started yesterday.  Patient denies fevers.  

## 2021-01-02 NOTE — ED Provider Notes (Signed)
[facility]    CSN: [id number] Arrival date & time: 01/02/21  1342      History   Chief Complaint Chief Complaint  Patient presents with  . Sore Throat    HPI Tonya Hill is a 30 y.o. female.   Tonya Hill presents with complaints of sore throat which started yesterday, and then was worse this morning. Occasional productive cough with some post nasal drip. No fevers. No headache or body aches. No gi symptoms. No known ill contacts. She did have covid in September of 2021.    ROS per HPI, negative if not otherwise mentioned.      Past Medical History:  Diagnosis Date  . Anemia   . Anxiety   . Depression   . Endometriosis   . Ovarian cyst     Patient Active Problem List   Diagnosis Date Noted  . Anxiety and depression 09/17/2015  . Obesity, Class II, BMI 35-39.9 09/17/2015    Past Surgical History:  Procedure Laterality Date  . INTRAUTERINE DEVICE INSERTION  12/2015    OB History    Gravida  1   Para  0   Term      Preterm  0   AB  1   Living        SAB  1   IAB      Ectopic      Multiple      Live Births               Home Medications    Prior to Admission medications   Medication Sig Start Date End Date Taking? Authorizing Provider  buPROPion (WELLBUTRIN XL) 150 MG 24 hr tablet Take 300 mg by mouth.  01/15/20  Yes [provider]  cholecalciferol (VITAMIN D3) 25 MCG (1000 UNIT) tablet Take 1,000 Units by mouth daily.   Yes [provider]  Prenatal Vit-Fe Fumarate-FA (MULTIVITAMIN-PRENATAL) 27-0.8 MG TABS tablet Take 1 tablet by mouth daily at 12 noon.   Yes [provider]  ibuprofen (ADVIL) 800 MG tablet Take 1 tablet (800 mg total) by mouth every 8 (eight) hours as needed for moderate pain. 12/28/19   Tommie Sams, DO  Vitamin D, Ergocalciferol, (DRISDOL) 1.25 MG (50000 UNIT) CAPS capsule Take 1 capsule (50,000 Units total) by mouth every 7 (seven) days. 10/19/20   Romualdo Bolk,  MD  levonorgestrel (MIRENA) 20 MCG/24HR IUD by Intrauterine route.  08/02/20  [provider]  phentermine (ADIPEX-P) 37.5 MG tablet Take by mouth. 06/02/19 12/28/19  [provider]    Family History Family History  Problem Relation Age of Onset  . Hypertension Sister   . Diabetes Paternal Grandfather   . Hypertension Paternal Grandfather   . Hypertension Father     Social History Social History   Tobacco Use  . Smoking status: Never Smoker  . Smokeless tobacco: Never Used  Vaping Use  . Vaping Use: Never used  Substance Use Topics  . Alcohol use: Yes    Alcohol/week: 3.0 standard drinks    Types: 3 Standard drinks or equivalent per week  . Drug use: No     Allergies   Sulfa antibiotics, Doxycycline, Latex, and Tape   Review of Systems Review of Systems   Physical Exam Triage Vital Signs ED Triage Vitals  Enc Vitals Group     BP 01/02/21 1416 120/67     Pulse Rate 01/02/21 1416 88     Resp 01/02/21 1416 14  Temp 01/02/21 1416 98.8 F (37.1 C)     Temp Source 01/02/21 1416 Oral     SpO2 01/02/21 1416 100 %     Weight 01/02/21 1413 220 lb (99.8 kg)     Height 01/02/21 1413 5' (1.524 m)     Head Circumference --      Peak Flow --      Pain Score 01/02/21 1413 2     Pain Loc --      Pain Edu? --      Excl. in GC? --    No data found.  Updated Vital Signs BP 120/67 (BP Location: Left Arm)   Pulse 88   Temp 98.8 F (37.1 C) (Oral)   Resp 14   Ht 5' (1.524 m)   Wt 220 lb (99.8 kg)   LMP 12/30/2020 (Exact Date)   SpO2 100%   BMI 42.97 kg/m   Visual Acuity Right Eye Distance:   Left Eye Distance:   Bilateral Distance:    Right Eye Near:   Left Eye Near:    Bilateral Near:     Physical Exam Constitutional:      General: She is not in acute distress.    Appearance: She is well-developed.  HENT:     Head: Normocephalic and atraumatic.     Mouth/Throat:     Tonsils: No tonsillar exudate. 1+ on the right. 1+ on the left.   Cardiovascular:     Rate and Rhythm: Normal rate.  Pulmonary:     Effort: Pulmonary effort is normal.  Skin:    General: Skin is warm and dry.  Neurological:     Mental Status: She is alert and oriented to person, place, and time.      UC Treatments / Results  Labs (all labs ordered are listed, but only abnormal results are displayed) Labs Reviewed  GROUP A STREP BY PCR  SARS CORONAVIRUS 2 (TAT 6-24 HRS)    EKG   Radiology No results found.  Procedures Procedures (including critical care time)  Medications Ordered in UC Medications - No data to display  Initial Impression / Assessment and Plan / UC Course  I have reviewed the triage vital signs and the nursing notes.  Pertinent labs & imaging results that were available during my care of the patient were reviewed by me and considered in my medical decision making (see chart for details).     Negative rapid strep. Non toxic. Benign physical exam.  History and physical consistent with viral illness. Covid testing pending and isolation instructions provided.  Supportive cares recommended. Return precautions provided. Patient verbalized understanding and agreeable to plan.   Final Clinical Impressions(s) / UC Diagnoses   Final diagnoses:  Pharyngitis, unspecified etiology     Discharge Instructions     Self isolate until covid results are back.  We will notify you by phone if it is positive. Your negative results will be sent through your MyChart.    If it is positive you need to isolate from others for a total of 5 days. If no fever for 24 hours without medications, and symptoms improving you may end isolation on day 6, but wear a mask if around any others for an additional 5 days.   Throat lozenges, gargles, chloraseptic spray, warm teas, popsicles etc to help with throat pain.   If symptoms worsen or do not improve in the next week to return to be seen or to follow up with your  PCP.  ED Prescriptions     None     PDMP not reviewed this encounter.   Georgetta Haber, NP 01/02/21 1645

## 2021-01-03 LAB — SARS CORONAVIRUS 2 (TAT 6-24 HRS): SARS Coronavirus 2: POSITIVE — AB

## 2021-01-06 ENCOUNTER — Ambulatory Visit: Payer: Managed Care, Other (non HMO) | Admitting: Obstetrics and Gynecology

## 2021-01-26 ENCOUNTER — Other Ambulatory Visit: Payer: Self-pay

## 2021-02-22 ENCOUNTER — Encounter: Payer: Self-pay | Admitting: Emergency Medicine

## 2021-05-31 ENCOUNTER — Other Ambulatory Visit: Payer: Self-pay

## 2021-05-31 ENCOUNTER — Encounter: Payer: Self-pay | Admitting: Emergency Medicine

## 2021-05-31 ENCOUNTER — Ambulatory Visit
Admission: EM | Admit: 2021-05-31 | Discharge: 2021-05-31 | Disposition: A | Discharge status: Home / Self Care | Payer: Managed Care, Other (non HMO) | Attending: Emergency Medicine | Admitting: Emergency Medicine

## 2021-05-31 DIAGNOSIS — B349 Viral infection, unspecified: Secondary | ICD-10-CM | POA: Insufficient documentation

## 2021-05-31 LAB — POCT RAPID STREP A (OFFICE): Rapid Strep A Screen: NEGATIVE

## 2021-05-31 NOTE — ED Triage Notes (Signed)
Patient c/o sore throat x 1 day.   Patient denies fever at home.   Patient endorses headache upon onset of symptoms.   Patient endorses LFT ear pain.   Patient denies headache, SOB, or Chest pain.    Patient endorses being treated for sinus infection May 17th & was give antibiotics and prednisone. Patient endorses a 7 day course of antibiotics.    Patient hasn't used any medications for symptoms.

## 2021-05-31 NOTE — Discharge Instructions (Addendum)
Your rapid strep test is negative.  A throat culture is pending; we will call you if it is positive requiring treatment.    Your COVID and Influenza tests are pending.  You should self quarantine until the test results are back.    Take Tylenol or ibuprofen as needed for fever or discomfort.  Rest and keep yourself hydrated.    Follow-up with your primary care provider if your symptoms are not improving.     

## 2021-05-31 NOTE — ED Provider Notes (Signed)
[facility]    CSN: [id number] Arrival date & time: 05/31/21  1300      History   Chief Complaint Chief Complaint  Patient presents with  . Sore Throat    HPI Tonya Hill is a 30 y.o. female.   Patient presents with left ear pain, sore throat x1 day.  She had a headache yesterday but none today.  No treatments attempted at home.  She denies fever, chills, rash, cough, shortness of breath, vomiting, diarrhea, or other symptoms.  Patient was seen and [facility] urgent [facility] on 05/10/2021; diagnosed with acute sinusitis; treated with prednisone, Augmentin, Flonase.  She was seen at [facility] urgent care on 04/25/2021; diagnosed with eustachian tube dysfunction; treated with Astelin nasal spray.  Her medical history includes anxiety, depression, obesity, endometriosis, vitamin D deficiency.  The history is provided by the patient and medical records.    Past Medical History:  Diagnosis Date  . Anemia   . Anxiety   . Depression   . Endometriosis   . Ovarian cyst   . Vitamin D deficiency 2020    Patient Active Problem List   Diagnosis Date Noted  . Anxiety and depression 09/17/2015  . Obesity, Class II, BMI 35-39.9 09/17/2015    Past Surgical History:  Procedure Laterality Date  . INTRAUTERINE DEVICE INSERTION  12/2015    OB History    Gravida  1   Para  0   Term  0   Preterm  0   AB  1   Living        SAB  1   IAB  0   Ectopic  0   Multiple      Live Births               Home Medications    Prior to Admission medications   Medication Sig Start Date End Date Taking? Authorizing Provider  buPROPion (WELLBUTRIN XL) 150 MG 24 hr tablet Take 300 mg by mouth.  01/15/20  Yes [provider]  cholecalciferol (VITAMIN D3) 25 MCG (1000 UNIT) tablet Take 1,000 Units by mouth daily.   Yes [provider]  Prenatal Vit-Fe Fumarate-FA (MULTIVITAMIN-PRENATAL) 27-0.8 MG TABS tablet Take 1 tablet by mouth daily at 12 noon.   Yes [provider]  ibuprofen (ADVIL) 800 MG tablet Take 1 tablet (800 mg total) by mouth every 8 (eight) hours as needed for moderate pain. 12/28/19   Tommie Sams, DO  Vitamin D, Ergocalciferol, (DRISDOL) 1.25 MG (50000 UNIT) CAPS capsule Take 1 capsule (50,000 Units total) by mouth every 7 (seven) days. 10/19/20   Romualdo Bolk, MD  levonorgestrel (MIRENA) 20 MCG/24HR IUD by Intrauterine route.  08/02/20  [provider]  phentermine (ADIPEX-P) 37.5 MG tablet Take by mouth. 06/02/19 12/28/19  [provider]    Family History Family History  Problem Relation Age of Onset  . Hypertension Sister   . Diabetes Paternal Grandfather   . Hypertension Paternal Grandfather   . Hypertension Father     Social History Social History   Tobacco Use  . Smoking status: Never Smoker  . Smokeless tobacco: Never Used  Vaping Use  . Vaping Use: Never used  Substance Use Topics  . Alcohol use: Yes    Alcohol/week: 3.0 standard drinks    Types: 3 Standard drinks or equivalent per week  . Drug use: No     Allergies   Sulfa antibiotics, Doxycycline, Latex, and Tape   Review  of Systems Review of Systems  Constitutional: Negative for chills and fever.  HENT: Positive for ear pain and sore throat.   Eyes: Negative for visual disturbance.  Respiratory: Negative for cough and shortness of breath.   Cardiovascular: Negative for chest pain and palpitations.  Gastrointestinal: Negative for abdominal pain, diarrhea and vomiting.  Skin: Negative for color change and rash.  Neurological: Positive for headaches. Negative for dizziness and weakness.  All other systems reviewed and are negative.    Physical Exam Triage Vital Signs ED Triage Vitals  Enc Vitals Group     BP 05/31/21 1310 121/71     Pulse Rate 05/31/21 1310 92     Resp 05/31/21 1310 16     Temp 05/31/21 1310 98.4 F (36.9 C)     Temp Source 05/31/21 1310 Oral     SpO2 05/31/21 1310 98 %     Weight --      Height  --      Head Circumference --      Peak Flow --      Pain Score 05/31/21 1312 3     Pain Loc --      Pain Edu? --      Excl. in GC? --    No data found.  Updated Vital Signs BP 121/71 (BP Location: Left Arm)   Pulse 92   Temp 98.4 F (36.9 C) (Oral)   Resp 16   LMP 05/21/2021   SpO2 98%   Visual Acuity Right Eye Distance:   Left Eye Distance:   Bilateral Distance:    Right Eye Near:   Left Eye Near:    Bilateral Near:     Physical Exam Vitals and nursing note reviewed.  Constitutional:      General: She is not in acute distress.    Appearance: She is well-developed. She is obese. She is not ill-appearing.  HENT:     Head: Normocephalic and atraumatic.     Right Ear: Tympanic membrane and ear canal normal.     Left Ear: Tympanic membrane and ear canal normal.     Nose: Nose normal.     Mouth/Throat:     Mouth: Mucous membranes are moist.     Pharynx: Oropharynx is clear.  Eyes:     Conjunctiva/sclera: Conjunctivae normal.  Cardiovascular:     Rate and Rhythm: Normal rate and regular rhythm.     Heart sounds: Normal heart sounds.  Pulmonary:     Effort: Pulmonary effort is normal. No respiratory distress.     Breath sounds: Normal breath sounds.  Abdominal:     Palpations: Abdomen is soft.     Tenderness: There is no abdominal tenderness.  Musculoskeletal:     Cervical back: Neck supple.  Skin:    General: Skin is warm and dry.  Neurological:     General: No focal deficit present.     Mental Status: She is alert and oriented to person, place, and time.     Gait: Gait normal.  Psychiatric:        Mood and Affect: Mood normal.        Behavior: Behavior normal.      UC Treatments / Results  Labs (all labs ordered are listed, but only abnormal results are displayed) Labs Reviewed  CULTURE, GROUP A STREP ([facility])  COVID-19, FLU A+B NAA  POCT RAPID STREP A (OFFICE)    EKG   Radiology No results found.  Procedures Procedures (including critical  care  time)  Medications Ordered in UC Medications - No data to display  Initial Impression / Assessment and Plan / UC Course  I have reviewed the triage vital signs and the nursing notes.  Pertinent labs & imaging results that were available during my care of the patient were reviewed by me and considered in my medical decision making (see chart for details).   Viral illness.  Rapid strep negative; culture pending.  Influenza and COVID pending.  Instructed patient to self quarantine per CDC guidelines.  Discussed symptomatic treatment including Tylenol or ibuprofen, rest, hydration.  Instructed patient to follow up with PCP if her symptoms are not improving.  Patient agrees to plan of care.    Final Clinical Impressions(s) / UC Diagnoses   Final diagnoses:  Viral illness     Discharge Instructions     Your rapid strep test is negative.  A throat culture is pending; we will call you if it is positive requiring treatment.    Your COVID and Influenza tests are pending.  You should self quarantine until the test results are back.    Take Tylenol or ibuprofen as needed for fever or discomfort.  Rest and keep yourself hydrated.    Follow-up with your primary care provider if your symptoms are not improving.        ED Prescriptions    None     PDMP not reviewed this encounter.   Mickie Bail, NP 05/31/21 1343

## 2021-06-02 LAB — COVID-19, FLU A+B NAA
Influenza A, NAA: NOT DETECTED
Influenza B, NAA: NOT DETECTED
SARS-CoV-2, NAA: NOT DETECTED

## 2021-06-03 LAB — CULTURE, GROUP A STREP (THRC)

## 2021-08-19 DIAGNOSIS — R1031 Right lower quadrant pain: Secondary | ICD-10-CM | POA: Insufficient documentation

## 2021-08-19 DIAGNOSIS — R112 Nausea with vomiting, unspecified: Secondary | ICD-10-CM | POA: Diagnosis not present

## 2021-08-19 DIAGNOSIS — R197 Diarrhea, unspecified: Secondary | ICD-10-CM | POA: Insufficient documentation

## 2021-08-19 DIAGNOSIS — Z91040 Latex allergy status: Secondary | ICD-10-CM | POA: Diagnosis not present

## 2021-08-19 DIAGNOSIS — R1011 Right upper quadrant pain: Secondary | ICD-10-CM | POA: Diagnosis not present

## 2021-08-19 NOTE — ED Triage Notes (Signed)
Pt presents to ER from home with complaints of RLQ pain, reports pain started about 2 days ago, reports today pain has been constant and describes pain as a sharp pain, last BM this afternoon, reports feeling nauseous denies any episodes of emesis. Pt talks in complete sentences no respiratory distress noted

## 2021-08-20 ENCOUNTER — Encounter: Payer: Self-pay | Admitting: Emergency Medicine

## 2021-08-20 ENCOUNTER — Emergency Department
Admission: EM | Admit: 2021-08-20 | Discharge: 2021-08-20 | Disposition: A | Discharge status: Home / Self Care | Payer: Managed Care, Other (non HMO) | Attending: Emergency Medicine | Admitting: Emergency Medicine

## 2021-08-20 ENCOUNTER — Other Ambulatory Visit: Payer: Self-pay

## 2021-08-20 ENCOUNTER — Emergency Department: Payer: Managed Care, Other (non HMO)

## 2021-08-20 DIAGNOSIS — R1031 Right lower quadrant pain: Secondary | ICD-10-CM

## 2021-08-20 LAB — CBC
HCT: 39.3 % (ref 36.0–46.0)
Hemoglobin: 12.8 g/dL (ref 12.0–15.0)
MCH: 28.3 pg (ref 26.0–34.0)
MCHC: 32.6 g/dL (ref 30.0–36.0)
MCV: 86.9 fL (ref 80.0–100.0)
Platelets: 400 10*3/uL (ref 150–400)
RBC: 4.52 MIL/uL (ref 3.87–5.11)
RDW: 12.5 % (ref 11.5–15.5)
WBC: 13 10*3/uL — ABNORMAL HIGH (ref 4.0–10.5)
nRBC: 0 % (ref 0.0–0.2)

## 2021-08-20 LAB — URINALYSIS, COMPLETE (UACMP) WITH MICROSCOPIC
Bilirubin Urine: NEGATIVE
Glucose, UA: NEGATIVE mg/dL
Ketones, ur: NEGATIVE mg/dL
Leukocytes,Ua: NEGATIVE
Nitrite: NEGATIVE
Protein, ur: NEGATIVE mg/dL
Specific Gravity, Urine: 1.012 (ref 1.005–1.030)
pH: 5 (ref 5.0–8.0)

## 2021-08-20 LAB — COMPREHENSIVE METABOLIC PANEL
ALT: 16 U/L (ref 0–44)
AST: 15 U/L (ref 15–41)
Albumin: 4.2 g/dL (ref 3.5–5.0)
Alkaline Phosphatase: 73 U/L (ref 38–126)
Anion gap: 6 (ref 5–15)
BUN: 10 mg/dL (ref 6–20)
CO2: 26 mmol/L (ref 22–32)
Calcium: 8.9 mg/dL (ref 8.9–10.3)
Chloride: 105 mmol/L (ref 98–111)
Creatinine, Ser: 1.19 mg/dL — ABNORMAL HIGH (ref 0.44–1.00)
GFR, Estimated: >60 mL/min (ref >60)
Glucose, Bld: 92 mg/dL (ref 70–99)
Potassium: 3.4 mmol/L — ABNORMAL LOW (ref 3.5–5.1)
Sodium: 137 mmol/L (ref 135–145)
Total Bilirubin: 0.7 mg/dL (ref 0.3–1.2)
Total Protein: 7.7 g/dL (ref 6.5–8.1)

## 2021-08-20 LAB — POC URINE PREG, ED: Preg Test, Ur: NEGATIVE

## 2021-08-20 LAB — LIPASE, BLOOD: Lipase: 25 U/L (ref 11–51)

## 2021-08-20 MED ORDER — ONDANSETRON 4 MG PO TBDP: 4.0000 mg | ORAL_TABLET | Freq: Three times a day (TID) | ORAL | 0 refills | Status: DC | PRN

## 2021-08-20 MED ORDER — MORPHINE SULFATE (PF) 4 MG/ML IV SOLN
4.0000 mg | Freq: Once | INTRAVENOUS | Status: AC
  Administered 2021-08-20: 4 mg via INTRAVENOUS
  Filled 2021-08-20: qty 1

## 2021-08-20 MED ORDER — ONDANSETRON 4 MG PO TBDP
4.0000 mg | ORAL_TABLET | Freq: Once | ORAL | Status: AC | PRN
  Administered 2021-08-20: 4 mg via ORAL
  Filled 2021-08-20: qty 1

## 2021-08-20 MED ORDER — IOHEXOL 350 MG/ML SOLN
100.0000 mL | Freq: Once | INTRAVENOUS | Status: AC | PRN
  Administered 2021-08-20: 100 mL via INTRAVENOUS

## 2021-08-20 MED ORDER — LACTATED RINGERS IV BOLUS
1000.0000 mL | Freq: Once | INTRAVENOUS | Status: AC
  Administered 2021-08-20: 1000 mL via INTRAVENOUS

## 2021-08-20 NOTE — ED Provider Notes (Signed)
[facility] Emergency Department Provider Note   ____________________________________________   Event Date/Time   First MD Initiated Contact with Patient 08/20/21 [phone]     (approximate)  I have reviewed the triage vital signs and the nursing notes.   HISTORY  Chief Complaint Abdominal Pain    HPI Tonya Hill is a 30 y.o. female with past medical history of endometriosis, anemia, and anxiety who presents to the ED complaining of abdominal pain.  Patient reports that last week she had an episode of diffuse abdominal pain with some vomiting and diarrhea.  This seemed to resolve but 2 days ago she started to notice pain around her umbilicus, that then moved towards her right lower quadrant.  Pain was initially intermittent but has been constant since this afternoon, described as sharp and exacerbated with sudden movements.  She has been feeling nauseous and complains of dry heaves, but has not had any vomiting or diarrhea.  She has not had any fevers and denies cough, chest pain, or shortness of breath.  She has had laparoscopic surgery for endometriosis previously, denies other abdominal surgeries.  She has been taking Tylenol and ibuprofen without significant relief.  She denies any dysuria, flank pain, vaginal bleeding, or discharge.        Past Medical History:  Diagnosis Date   Anemia    Anxiety    Depression    Endometriosis    Ovarian cyst    Vitamin D deficiency 2020    Patient Active Problem List   Diagnosis Date Noted   Anxiety and depression 09/17/2015   Obesity, Class II, BMI 35-39.9 09/17/2015    Past Surgical History:  Procedure Laterality Date   INTRAUTERINE DEVICE INSERTION  12/2015    Prior to Admission medications   Medication Sig Start Date End Date Taking? Authorizing Provider  ondansetron (ZOFRAN ODT) 4 MG disintegrating tablet Take 1 tablet (4 mg total) by mouth every 8 (eight) hours as needed for nausea or vomiting.  08/20/21  Yes Chesley Noon, MD  buPROPion (WELLBUTRIN XL) 150 MG 24 hr tablet Take 300 mg by mouth.  01/15/20   [provider]  cholecalciferol (VITAMIN D3) 25 MCG (1000 UNIT) tablet Take 1,000 Units by mouth daily.    [provider]  ibuprofen (ADVIL) 800 MG tablet Take 1 tablet (800 mg total) by mouth every 8 (eight) hours as needed for moderate pain. 12/28/19   Tommie Sams, DO  Prenatal Vit-Fe Fumarate-FA (MULTIVITAMIN-PRENATAL) 27-0.8 MG TABS tablet Take 1 tablet by mouth daily at 12 noon.    [provider]  Vitamin D, Ergocalciferol, (DRISDOL) 1.25 MG (50000 UNIT) CAPS capsule Take 1 capsule (50,000 Units total) by mouth every 7 (seven) days. 10/19/20   Romualdo Bolk, MD  levonorgestrel (MIRENA) 20 MCG/24HR IUD by Intrauterine route.  08/02/20  [provider]  phentermine (ADIPEX-P) 37.5 MG tablet Take by mouth. 06/02/19 12/28/19  [provider]    Allergies Sulfa antibiotics, Doxycycline, Latex, Omnicef [cefdinir], and Tape  Family History  Problem Relation Age of Onset   Hypertension Sister    Diabetes Paternal Grandfather    Hypertension Paternal Grandfather    Hypertension Father     Social History Social History   Tobacco Use   Smoking status: Never   Smokeless tobacco: Never  Vaping Use   Vaping Use: Never used  Substance Use Topics   Alcohol use: Yes    Alcohol/week: 3.0 standard drinks    Types: 3 Standard  drinks or equivalent per week   Drug use: No    Review of Systems  Constitutional: No fever/chills Eyes: No visual changes. ENT: No sore throat. Cardiovascular: Denies chest pain. Respiratory: Denies shortness of breath. Gastrointestinal: Positive for abdominal pain and nausea, no vomiting.  No diarrhea.  No constipation. Genitourinary: Negative for dysuria. Musculoskeletal: Negative for back pain. Skin: Negative for rash. Neurological: Negative for headaches, focal weakness or  numbness.  ____________________________________________   PHYSICAL EXAM:  VITAL SIGNS: ED Triage Vitals  Enc Vitals Group     BP 08/19/21 2359 130/70     Pulse Rate 08/19/21 2359 (!) 117     Resp 08/19/21 2359 20     Temp 08/19/21 2359 98.3 F (36.8 C)     Temp Source 08/19/21 2359 Oral     SpO2 08/19/21 2359 100 %     Weight 08/20/21 0000 220 lb (99.8 kg)     Height 08/20/21 0000 5\' 1"  (1.549 m)     Head Circumference --      Peak Flow --      Pain Score 08/19/21 2359 7     Pain Loc --      Pain Edu? --      Excl. in GC? --     Constitutional: Alert and oriented. Eyes: Conjunctivae are normal. Head: Atraumatic. Nose: No congestion/rhinnorhea. Mouth/Throat: Mucous membranes are moist. Neck: Normal ROM Cardiovascular: Normal rate, regular rhythm. Grossly normal heart sounds.  2+ radial pulses bilaterally. Respiratory: Normal respiratory effort.  No retractions. Lungs CTAB. Gastrointestinal: Soft and tender to palpation in right upper and lower quadrants with no rebound or guarding. No distention. Genitourinary: deferred Musculoskeletal: No lower extremity tenderness nor edema. Neurologic:  Normal speech and language. No gross focal neurologic deficits are appreciated. Skin:  Skin is warm, dry and intact. No rash noted. Psychiatric: Mood and affect are normal. Speech and behavior are normal.  ____________________________________________   LABS (all labs ordered are listed, but only abnormal results are displayed)  Labs Reviewed  COMPREHENSIVE METABOLIC PANEL - Abnormal; Notable for the following components:      Result Value   Potassium 3.4 (*)    Creatinine, Ser 1.19 (*)    All other components within normal limits  CBC - Abnormal; Notable for the following components:   WBC 13.0 (*)    All other components within normal limits  URINALYSIS, COMPLETE (UACMP) WITH MICROSCOPIC - Abnormal; Notable for the following components:   Color, Urine YELLOW (*)     APPearance CLEAR (*)    Hgb urine dipstick LARGE (*)    Bacteria, UA MANY (*)    All other components within normal limits  LIPASE, BLOOD  POC URINE PREG, ED    PROCEDURES  Procedure(s) performed (including Critical Care):  Procedures   ____________________________________________   INITIAL IMPRESSION / ASSESSMENT AND PLAN / ED COURSE      30 year old female with past medical history of endometriosis, anemia, and anxiety who presents to the ED with 2 days of abdominal pain initially around her umbilicus but since moving towards her right lower quadrant and becoming constant.  She is tender to palpation in the right upper quadrant and right lower quadrants of her abdomen, we will further assess for appendicitis or biliary process with CT scan.  Differential also includes endometriosis or ovarian cyst.  Labs are unremarkable, LFTs and lipase within normal limits.  Pregnancy testing is negative, UA is pending but patient denies urinary symptoms.  Her nausea has improved  following Zofran, we will treat with IV morphine and reassess following CT.  CT scan is negative for acute process, appendix and gallbladder both with normal appearance, no evidence of ovarian cyst.  On reassessment, patient is comfortable with minimal pain.  She is appropriate for discharge home, given her history of endometriosis I have encouraged her to follow-up with her OB/GYN as well as her PCP.  She was counseled to return to the ED for new or worsening symptoms, patient agrees with plan.      ____________________________________________   FINAL CLINICAL IMPRESSION(S) / ED DIAGNOSES  Final diagnoses:  Right lower quadrant abdominal pain     ED Discharge Orders          Ordered    ondansetron (ZOFRAN ODT) 4 MG disintegrating tablet  Every 8 hours PRN        08/20/21 0349             Note:  This document was prepared using Dragon voice recognition software and may include unintentional dictation  errors.    Chesley Noon, MD 08/20/21 [phone]

## 2021-10-20 ENCOUNTER — Ambulatory Visit: Payer: Managed Care, Other (non HMO) | Admitting: Nurse Practitioner

## 2021-10-24 ENCOUNTER — Encounter: Payer: Self-pay | Admitting: Nurse Practitioner

## 2021-10-24 ENCOUNTER — Other Ambulatory Visit: Payer: Self-pay

## 2021-10-24 ENCOUNTER — Ambulatory Visit (INDEPENDENT_AMBULATORY_CARE_PROVIDER_SITE_OTHER): Payer: Managed Care, Other (non HMO) | Admitting: Nurse Practitioner

## 2021-10-24 VITALS — BP 124/80 | Ht 60.0 in | Wt 215.0 lb

## 2021-10-24 DIAGNOSIS — Z8742 Personal history of other diseases of the female genital tract: Secondary | ICD-10-CM

## 2021-10-24 DIAGNOSIS — Z01419 Encounter for gynecological examination (general) (routine) without abnormal findings: Secondary | ICD-10-CM

## 2021-10-24 DIAGNOSIS — R350 Frequency of micturition: Secondary | ICD-10-CM

## 2021-10-24 DIAGNOSIS — N979 Female infertility, unspecified: Secondary | ICD-10-CM

## 2021-10-24 NOTE — Progress Notes (Signed)
Tonya Hill 1991/10/17 [id number]   History:  30 y.o. G1P0010 presents for annual exam. Monthly cycles. Normal pap history. Gardasil series completed. 2013 endometriosis confirmed by laparoscopy. Has been trying to conceive for 2 years. Saw Dr. Darreld Mclean at [facility] earlier this year. Saving up for IUI. Spontaneous abortion in 2011. Feels like her endometriosis has returned due to RLQ abdominal pain that is worse with menses. Takes Ibuprofen as needed. Complains of urinary frequency without dysuria, urgency, or hematuria.   Gynecologic History Patient's last menstrual period was 09/29/2021. Period Cycle (Days): 28 (Sometimes a few days early or late) Period Duration (Days): 7 Period Pattern: Regular Menstrual Flow: Light, Moderate, Heavy (varies) Dysmenorrhea: (!) Moderate Dysmenorrhea Symptoms: Cramping Contraception/Family planning: none Sexually active: Yes  Health Maintenance Last Pap: 10/15/2019. Results were: Normal, 3-year repeat Last mammogram: Not indicated Last colonoscopy: Not indicated Last Dexa: Not indicated  Past medical history, past surgical history, family history and social history were all reviewed and documented in the EPIC chart. Married. 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 Husband works in Architect.   ROS:  A ROS was performed and pertinent positives and negatives are included.  Exam:  Vitals:   10/24/21 1033  BP: 124/80  Weight: 215 lb (97.5 kg)  Height: 5' (1.524 m)   Body mass index is 41.99 kg/m.  General appearance:  Normal Thyroid:  Symmetrical, normal in size, without palpable masses or nodularity. Respiratory  Auscultation:  Clear without wheezing or rhonchi Cardiovascular  Auscultation:  Regular rate, without rubs, murmurs or gallops  Edema/varicosities:  Not grossly evident Abdominal  Soft,tender to RLQ, without masses, guarding or rebound.  Liver/spleen:  No organomegaly noted  Hernia:  None  appreciated  Skin  Inspection:  Grossly normal Breasts: Examined lying and sitting.   Right: Without masses, retractions, nipple discharge or axillary adenopathy.   Left: Without masses, retractions, nipple discharge or axillary adenopathy. Genitourinary   Inguinal/mons:  Normal without inguinal adenopathy  External genitalia:  Normal appearing vulva with no masses, tenderness, or lesions  BUS/Urethra/Skene's glands:  Normal  Vagina:  Normal appearing with normal color and discharge, no lesions  Cervix:  Normal appearing without discharge or lesions  Uterus:  Normal in size, shape and contour.  Midline and mobile, nontender  Adnexa/parametria:     Rt: Normal in size, without masses or tenderness.   Lt: Normal in size, without masses or tenderness.  Anus and perineum: Normal  Patient informed chaperone available to be present for breast and pelvic exam. Patient has requested no chaperone to be present. Patient has been advised what will be completed during breast and pelvic exam.   Assessment/Plan:  30 y.o. G1P0010 for annual exam.   Well female exam with routine gynecological exam - Education provided on SBEs, importance of preventative screenings, current guidelines, high calcium diet, regular exercise, and multivitamin daily. Labs with PCP.   Female infertility, primary - Has been trying to conceive for 2 years. Saw Dr. Darreld Mclean at [facility] earlier this year. Saving up for IUI. She continues to monitor ovulation.   History of endometriosis - confirmed through laparoscopic procedure in 2013. Feels like her endometriosis has returned due to RLQ abdominal pain that is worse with menses. Takes Ibuprofen as needed.  Urinary frequency - Plan: Urinalysis w microscopic + reflex culture. UA unremarkable.   Screening for cervical cancer - Normal pap history. Will repeat at 3-year interval per guidelines.   Return in 1 year for annual.  Olivia Mackie DNP, 11:00 AM  10/24/2021

## 2021-10-25 LAB — URINALYSIS W MICROSCOPIC + REFLEX CULTURE
Bilirubin Urine: NEGATIVE
Glucose, UA: NEGATIVE
Hyaline Cast: NONE SEEN /LPF
Ketones, ur: NEGATIVE
Leukocyte Esterase: NEGATIVE
Nitrites, Initial: NEGATIVE
Protein, ur: NEGATIVE
Specific Gravity, Urine: 1.01 (ref 1.001–1.035)
pH: 6 (ref 5.0–8.0)

## 2021-10-25 LAB — NO CULTURE INDICATED

## 2021-11-29 DIAGNOSIS — F988 Other specified behavioral and emotional disorders with onset usually occurring in childhood and adolescence: Secondary | ICD-10-CM | POA: Diagnosis present

## 2022-03-24 ENCOUNTER — Other Ambulatory Visit: Payer: Self-pay | Admitting: Otolaryngology

## 2022-03-24 DIAGNOSIS — H9202 Otalgia, left ear: Secondary | ICD-10-CM

## 2022-04-21 ENCOUNTER — Ambulatory Visit
Admission: RE | Admit: 2022-04-21 | Discharge: 2022-04-21 | Disposition: A | Discharge status: Home / Self Care | Payer: Managed Care, Other (non HMO) | Source: Ambulatory Visit | Attending: Otolaryngology | Admitting: Otolaryngology

## 2022-04-21 DIAGNOSIS — H9202 Otalgia, left ear: Secondary | ICD-10-CM

## 2022-05-23 ENCOUNTER — Other Ambulatory Visit: Payer: Self-pay

## 2022-05-23 ENCOUNTER — Encounter: Payer: Self-pay | Admitting: *Deleted

## 2022-05-23 ENCOUNTER — Emergency Department: Payer: Managed Care, Other (non HMO)

## 2022-05-23 DIAGNOSIS — D72829 Elevated white blood cell count, unspecified: Secondary | ICD-10-CM | POA: Diagnosis not present

## 2022-05-23 DIAGNOSIS — K30 Functional dyspepsia: Secondary | ICD-10-CM | POA: Diagnosis not present

## 2022-05-23 DIAGNOSIS — R1011 Right upper quadrant pain: Secondary | ICD-10-CM | POA: Diagnosis present

## 2022-05-23 LAB — HEPATIC FUNCTION PANEL
ALT: 17 U/L (ref 0–44)
AST: 18 U/L (ref 15–41)
Albumin: 3.8 g/dL (ref 3.5–5.0)
Alkaline Phosphatase: 74 U/L (ref 38–126)
Bilirubin, Direct: <0.1 mg/dL (ref 0.0–0.2)
Total Bilirubin: 0.4 mg/dL (ref 0.3–1.2)
Total Protein: 7.4 g/dL (ref 6.5–8.1)

## 2022-05-23 LAB — BASIC METABOLIC PANEL
Anion gap: 6 (ref 5–15)
BUN: 13 mg/dL (ref 6–20)
CO2: 25 mmol/L (ref 22–32)
Calcium: 8.6 mg/dL — ABNORMAL LOW (ref 8.9–10.3)
Chloride: 105 mmol/L (ref 98–111)
Creatinine, Ser: 1.04 mg/dL — ABNORMAL HIGH (ref 0.44–1.00)
GFR, Estimated: >60 mL/min (ref >60)
Glucose, Bld: 91 mg/dL (ref 70–99)
Potassium: 3.9 mmol/L (ref 3.5–5.1)
Sodium: 136 mmol/L (ref 135–145)

## 2022-05-23 LAB — POC URINE PREG, ED: Preg Test, Ur: NEGATIVE

## 2022-05-23 LAB — CBC
HCT: 38.6 % (ref 36.0–46.0)
Hemoglobin: 12.6 g/dL (ref 12.0–15.0)
MCH: 28.8 pg (ref 26.0–34.0)
MCHC: 32.6 g/dL (ref 30.0–36.0)
MCV: 88.3 fL (ref 80.0–100.0)
Platelets: 414 10*3/uL — ABNORMAL HIGH (ref 150–400)
RBC: 4.37 MIL/uL (ref 3.87–5.11)
RDW: 12.4 % (ref 11.5–15.5)
WBC: 14.3 10*3/uL — ABNORMAL HIGH (ref 4.0–10.5)
nRBC: 0 % (ref 0.0–0.2)

## 2022-05-23 LAB — URINALYSIS, ROUTINE W REFLEX MICROSCOPIC
Bilirubin Urine: NEGATIVE
Glucose, UA: NEGATIVE mg/dL
Ketones, ur: NEGATIVE mg/dL
Leukocytes,Ua: NEGATIVE
Nitrite: NEGATIVE
Protein, ur: NEGATIVE mg/dL
Specific Gravity, Urine: 1.015 (ref 1.005–1.030)
pH: 5 (ref 5.0–8.0)

## 2022-05-23 LAB — LIPASE, BLOOD: Lipase: 25 U/L (ref 11–51)

## 2022-05-23 LAB — TROPONIN I (HIGH SENSITIVITY)
Troponin I (High Sensitivity): 6 ng/L (ref <18)
Troponin I (High Sensitivity): 5 ng/L (ref <18)

## 2022-05-23 NOTE — ED Provider Triage Note (Signed)
Emergency Medicine Provider Triage Evaluation Note  Tonya Hill , a 31 y.o. female  was evaluated in triage.  Pt complains of chest pain and RUQ pain x 3 days with nausea and vomiting. .  Review of Systems  Positive: RUQ and chest pain. Nausea, vomiting Negative: Fever  Physical Exam  There were no vitals taken for this visit. Gen:   Awake, no distress   Resp:  Normal effort  MSK:   Moves extremities without difficulty  Other:    Medical Decision Making  Medically screening exam initiated at 8:23 PM.  Appropriate orders placed.  [name] was informed that the remainder of the evaluation will be completed by another provider, this initial triage assessment does not replace that evaluation, and the importance of remaining in the ED until their evaluation is complete   Chinita Pester, FNP 05/23/22 2026

## 2022-05-23 NOTE — ED Triage Notes (Signed)
Pt has ruq pain for 3 days ago, and then returned today.  N/v.  Pt also has chest pain for 3days.  Intermittent pain.  Pt alert speech clear.

## 2022-05-24 ENCOUNTER — Emergency Department: Payer: Managed Care, Other (non HMO)

## 2022-05-24 ENCOUNTER — Emergency Department
Admission: EM | Admit: 2022-05-24 | Discharge: 2022-05-24 | Disposition: A | Discharge status: Home / Self Care | Payer: Managed Care, Other (non HMO) | Attending: Emergency Medicine | Admitting: Emergency Medicine

## 2022-05-24 DIAGNOSIS — R1011 Right upper quadrant pain: Secondary | ICD-10-CM

## 2022-05-24 DIAGNOSIS — K30 Functional dyspepsia: Secondary | ICD-10-CM

## 2022-05-24 MED ORDER — KETOROLAC TROMETHAMINE 15 MG/ML IJ SOLN
15.0000 mg | Freq: Once | INTRAMUSCULAR | Status: AC
  Administered 2022-05-24: 15 mg via INTRAVENOUS
  Filled 2022-05-24: qty 1

## 2022-05-24 MED ORDER — PANTOPRAZOLE SODIUM 40 MG PO TBEC: 40.0000 mg | DELAYED_RELEASE_TABLET | Freq: Every day | ORAL | 1 refills | Status: DC

## 2022-05-24 MED ORDER — FAMOTIDINE IN NACL 20-0.9 MG/50ML-% IV SOLN
20.0000 mg | Freq: Once | INTRAVENOUS | Status: AC
  Administered 2022-05-24: 20 mg via INTRAVENOUS
  Filled 2022-05-24: qty 50

## 2022-05-24 MED ORDER — ONDANSETRON HCL 4 MG/2ML IJ SOLN
4.0000 mg | Freq: Once | INTRAMUSCULAR | Status: AC
  Administered 2022-05-24: 4 mg via INTRAVENOUS
  Filled 2022-05-24: qty 2

## 2022-05-24 NOTE — ED Provider Notes (Signed)
[facility] Provider Note    Event Date/Time   First MD Initiated Contact with Patient 05/24/22 0111     (approximate)   History   Abdominal Pain and Chest Pain   HPI  Tonya Hill is a 31 y.o. female with a history of endometriosis, ovarian cyst, depression, anxiety, anemia who presents for evaluation of abdominal pain.  Patient reports having similar pain 3 days ago.  She describes the pain as sharp, located in the right upper quadrant.  Usually the pain lasts several minutes and resolved without intervention but today was more persistent.  Over the last 3 days she also has had some chest pain that she describes as a burning sensation after she eats.  At this time she is complaining of moderate constant right upper quadrant abdominal pain with no radiation.  She denies any chest pain today.  She did have some nausea and one episode of vomiting associated with the pain.  No prior abdominal surgeries, no shortness of breath, no urinary symptoms.     Past Medical History:  Diagnosis Date   Anemia    Anxiety    Depression    Endometriosis    Ovarian cyst    Vitamin D deficiency 2020    Past Surgical History:  Procedure Laterality Date   INTRAUTERINE DEVICE INSERTION  12/2015     Physical Exam   Triage Vital Signs: ED Triage Vitals [05/23/22 2024]  Enc Vitals Group     BP (!) 141/80     Pulse Rate 88     Resp 18     Temp 98.5 F (36.9 C)     Temp Source Oral     SpO2 97 %     Weight 215 lb (97.5 kg)     Height 5' (1.524 m)     Head Circumference      Peak Flow      Pain Score 7     Pain Loc      Pain Edu?      Excl. in GC?     Most recent vital signs: Vitals:   05/23/22 2024 05/23/22 2234  BP: (!) 141/80 (!) 148/87  Pulse: 88 78  Resp: 18 17  Temp: 98.5 F (36.9 C) 98.4 F (36.9 C)  SpO2: 97% 99%     Constitutional: Alert and oriented. Well appearing and in no apparent distress. HEENT:      Head: Normocephalic and  atraumatic.         Eyes: Conjunctivae are normal. Sclera is non-icteric.       Mouth/Throat: Mucous membranes are moist.       Neck: Supple with no signs of meningismus. Cardiovascular: Regular rate and rhythm. No murmurs, gallops, or rubs. 2+ symmetrical distal pulses are present in all extremities.  Respiratory: Normal respiratory effort. Lungs are clear to auscultation bilaterally.  Gastrointestinal: Soft, tender to palpation on the RUQ, and non distended with positive bowel sounds. No rebound or guarding. Genitourinary: No CVA tenderness. Musculoskeletal:  No edema, cyanosis, or erythema of extremities. Neurologic: Normal speech and language. Face is symmetric. Moving all extremities. No gross focal neurologic deficits are appreciated. Skin: Skin is warm, dry and intact. No rash noted. Psychiatric: Mood and affect are normal. Speech and behavior are normal.  ED Results / Procedures / Treatments   Labs (all labs ordered are listed, but only abnormal results are displayed) Labs Reviewed  BASIC METABOLIC PANEL - Abnormal; Notable for the following components:  Result Value   Creatinine, Ser 1.04 (*)    Calcium 8.6 (*)    All other components within normal limits  CBC - Abnormal; Notable for the following components:   WBC 14.3 (*)    Platelets 414 (*)    All other components within normal limits  URINALYSIS, ROUTINE W REFLEX MICROSCOPIC - Abnormal; Notable for the following components:   Color, Urine YELLOW (*)    APPearance HAZY (*)    Hgb urine dipstick MODERATE (*)    Bacteria, UA RARE (*)    All other components within normal limits  LIPASE, BLOOD  HEPATIC FUNCTION PANEL  POC URINE PREG, ED  TROPONIN I (HIGH SENSITIVITY)  TROPONIN I (HIGH SENSITIVITY)     EKG  ED ECG REPORT I, Nita Sickle, the attending physician, personally viewed and interpreted this ECG.  Sinus rhythm with a rate of 86, normal intervals, normal axis, no ST elevations or  depressions  RADIOLOGY I, Nita Sickle, attending MD, have personally viewed and interpreted the images obtained during this visit as below:  Ultrasound negative for cholecystitis or cholelithiasis  Chest x-ray is negative  ___________________________________________________ Interpretation by Radiologist:  DG Chest 2 View  Result Date: 05/23/2022 CLINICAL DATA:  Chest pain EXAM: CHEST - 2 VIEW COMPARISON:  10/31/2006 FINDINGS: Lungs are clear.  No pleural effusion or pneumothorax. The heart is normal in size. Visualized osseous structures are within normal limits. IMPRESSION: Normal chest radiographs. Electronically Signed   By: Charline Bills M.D.   On: 05/23/2022 20:45   US ABDOMEN LIMITED RUQ (LIVER/GB)  Result Date: 05/24/2022 CLINICAL DATA:  Right upper quadrant pain EXAM: ULTRASOUND ABDOMEN LIMITED RIGHT UPPER QUADRANT COMPARISON:  None Available. FINDINGS: Gallbladder: Non mobile nonshadowing 7 mm focus along the fundus compatible with polyp. No stones or wall thickening. Negative sonographic Murphy sign. Common bile duct: Diameter: Normal caliber, 2 mm. Liver: No focal lesion identified. Within normal limits in parenchymal echogenicity. Portal vein is patent on color Doppler imaging with normal direction of blood flow towards the liver. Other: None. IMPRESSION: 7 mm gallbladder wall polyp. No evidence of cholelithiasis or acute cholecystitis. No acute findings. Electronically Signed   By: Charlett Nose M.D.   On: 05/24/2022 03:07       PROCEDURES:  Critical Care performed: No  Procedures    IMPRESSION / MDM / ASSESSMENT AND PLAN / ED COURSE  I reviewed the triage vital signs and the nursing notes.  31 y.o. female with a history of endometriosis, ovarian cyst, depression, anxiety, anemia who presents for evaluation of abdominal pain.  On exam she is well-appearing in no distress, she is tender to palpation on the right upper quadrant with no rebound or guarding.  Ddx:  Gallbladder pathology versus indigestion/GERD versus pancreatitis versus kidney stone versus UTI versus appendicitis   Plan: CBC, CMP, lipase, pregnancy test, urinalysis, right upper quadrant ultrasound.  We will treat with IV Toradol, Zofran and Pepcid.   MEDICATIONS GIVEN IN ED: Medications  ketorolac (TORADOL) 15 MG/ML injection 15 mg (15 mg Intravenous Given 05/24/22 0148)  ondansetron (ZOFRAN) injection 4 mg (4 mg Intravenous Given 05/24/22 0148)  famotidine (PEPCID) IVPB 20 mg premix (0 mg Intravenous Stopped 05/24/22 0234)     ED COURSE: Patient does have an elevated white count of 14.3, normal LFTs and lipase, negative pregnancy test, UA with no signs of UTI.  EKG and troponins are unremarkable.  Right upper quadrant ultrasound showing no signs of cholelithiasis or cholecystitis.  Chest x-ray is  negative.  After above treatments patient's pain has resolved.  Possibly indigestion considering the patient has had intermittent burning chest pain as well.  Low suspicion for appendicitis with intermittent pain for 3 days and improvement of her symptoms in the ER.  Will discharge home on supportive care and with a prescription for Protonix.  Recommended follow-up with GI if symptoms persist.  Discussed my standard return precautions especially if patient has a fever or pain in the right lower quadrant.   Consults: none   EMR reviewed including last visit with her primary care doctor from December 2022 for hyperlipidemia, anxiety and depression.  Patient was also seen in the ED on August 2022 for similar complaints, also had an elevated white count at that time with a negative CT scan.    FINAL CLINICAL IMPRESSION(S) / ED DIAGNOSES   Final diagnoses:  RUQ abdominal pain  Indigestion     Rx / DC Orders   ED Discharge Orders          Ordered    pantoprazole (PROTONIX) 40 MG tablet  Daily        05/24/22 5462             Note:  This document was prepared using Dragon voice  recognition software and may include unintentional dictation errors.   Please note:  Patient was evaluated in Emergency Department today for the symptoms described in the history of present illness. Patient was evaluated in the context of the global COVID-19 pandemic, which necessitated consideration that the patient might be at risk for infection with the SARS-CoV-2 virus that causes COVID-19. Institutional protocols and algorithms that pertain to the evaluation of patients at risk for COVID-19 are in a state of rapid change based on information released by regulatory bodies including the CDC and federal and state organizations. These policies and algorithms were followed during the patient's care in the ED.  Some ED evaluations and interventions may be delayed as a result of limited staffing during the pandemic.       Don Perking, [place], MD 05/24/22 [phone]

## 2022-07-18 ENCOUNTER — Emergency Department
Admission: EM | Admit: 2022-07-18 | Discharge: 2022-07-18 | Discharge status: Against Medical Advice | Payer: Managed Care, Other (non HMO) | Attending: Emergency Medicine | Admitting: Emergency Medicine

## 2022-07-18 ENCOUNTER — Encounter: Payer: Self-pay | Admitting: *Deleted

## 2022-07-18 ENCOUNTER — Other Ambulatory Visit: Payer: Self-pay

## 2022-07-18 DIAGNOSIS — R519 Headache, unspecified: Secondary | ICD-10-CM | POA: Insufficient documentation

## 2022-07-18 DIAGNOSIS — Z5321 Procedure and treatment not carried out due to patient leaving prior to being seen by health care provider: Secondary | ICD-10-CM | POA: Insufficient documentation

## 2022-07-18 DIAGNOSIS — H9202 Otalgia, left ear: Secondary | ICD-10-CM | POA: Diagnosis not present

## 2022-07-18 DIAGNOSIS — R110 Nausea: Secondary | ICD-10-CM | POA: Diagnosis not present

## 2022-07-18 NOTE — ED Triage Notes (Signed)
Pt has a headache since noon today.  Pt now has left earache.  Pt taking otc meds without relief. Pt has nausea. Pt alert   speech clear.

## 2022-07-18 NOTE — ED Triage Notes (Signed)
Pt to desk stating she is going home

## 2022-08-25 ENCOUNTER — Other Ambulatory Visit: Payer: Self-pay | Admitting: Gastroenterology

## 2022-08-25 DIAGNOSIS — R1011 Right upper quadrant pain: Secondary | ICD-10-CM

## 2022-09-07 ENCOUNTER — Encounter
Admission: RE | Admit: 2022-09-07 | Discharge: 2022-09-07 | Disposition: A | Discharge status: Home / Self Care | Payer: Managed Care, Other (non HMO) | Source: Ambulatory Visit | Attending: Gastroenterology | Admitting: Gastroenterology

## 2022-09-07 DIAGNOSIS — R1011 Right upper quadrant pain: Secondary | ICD-10-CM | POA: Insufficient documentation

## 2022-09-07 MED ORDER — TECHNETIUM TC 99M MEBROFENIN IV KIT
5.1500 | PACK | Freq: Once | INTRAVENOUS | Status: AC | PRN
  Administered 2022-09-07: 5.15 via INTRAVENOUS

## 2022-12-08 IMAGING — CT CT TEMPORAL BONES W/O CM
2 of 3 series · 13 of 30 positions shown, 16 images · non-contrast
Comparison: No pertinent prior exam.

CLINICAL DATA: 30-year-old female with recurrent ear fluid and
infection without hearing loss. Antibiotics.

EXAM:
CT TEMPORAL BONES WITHOUT CONTRAST
TECHNIQUE: Axial and coronal plane CT imaging of the petrous temporal bones was
performed with thin-collimation image reconstruction. No intravenous
contrast was administered. Multiplanar CT image reconstructions were
also generated.
RADIATION DOSE REDUCTION: This exam was performed according to the
departmental dose-optimization program which includes automated
exposure control, adjustment of the mA and/or kV according to
patient size and/or use of iterative reconstruction technique.

[Series 3: temp bones soft axial full · axial · 0.40mm/px · z∈[-158,-140]mm · 2 of 28 slices shown]
[im 10/28  brain]
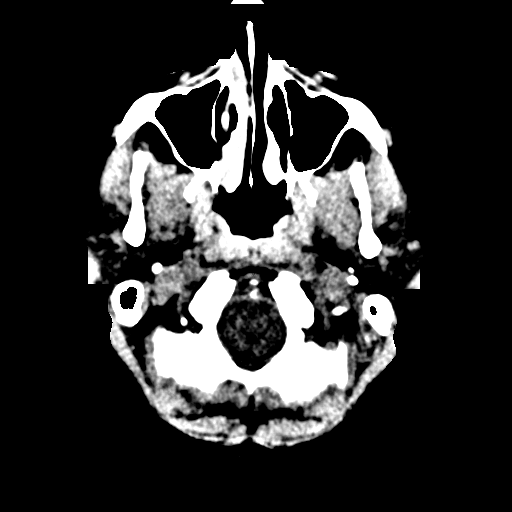
[im 19/28  brain]
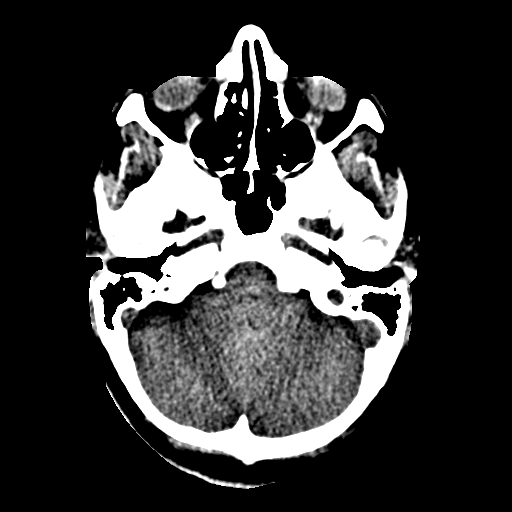

[Series 5: temp bones rt mag axial · axial · 0.18mm/px · z∈[-172,-126]mm · 11 of 92 slices shown, 14 images]
[im 8/92  brain]
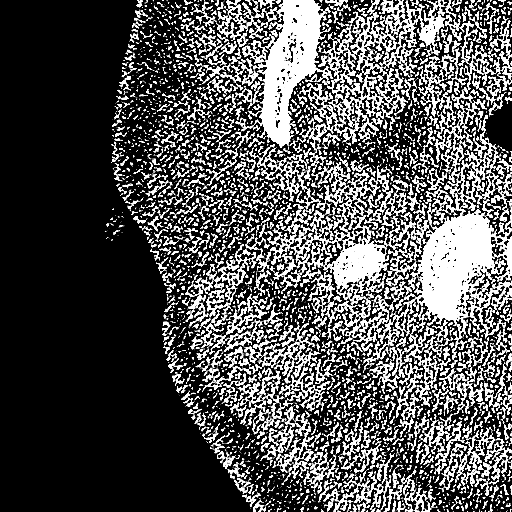
[im 8/92  bone]
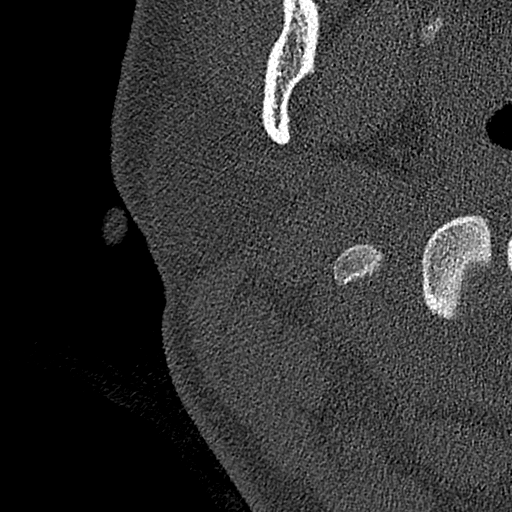
[im 16/92  bone]
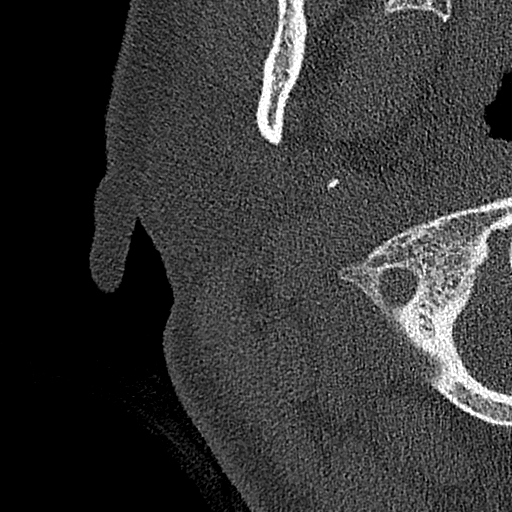
[im 23/92  bone]
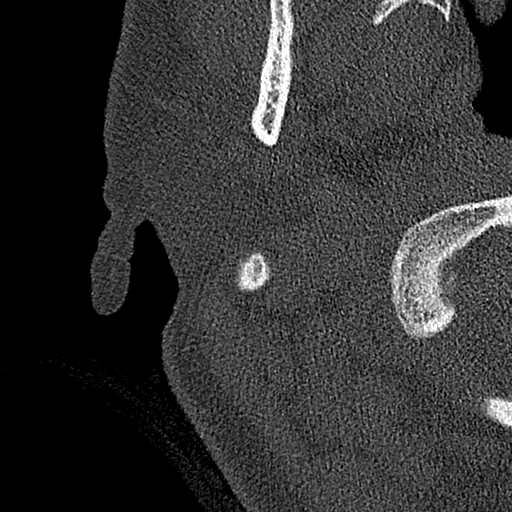
[im 31/92  bone]
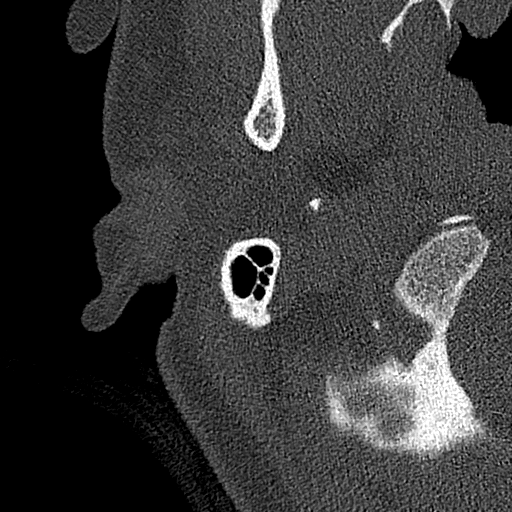
[im 38/92  brain]
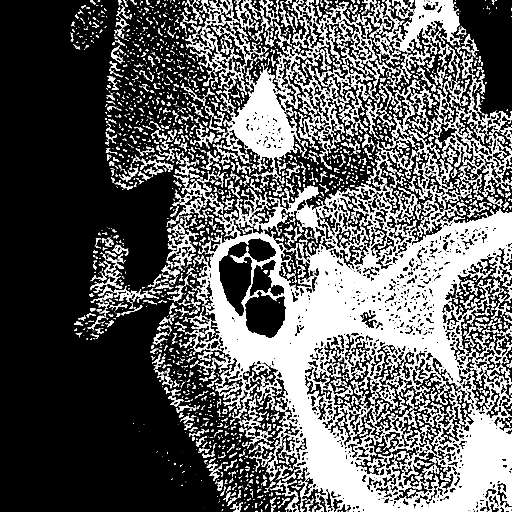
[im 38/92  bone]
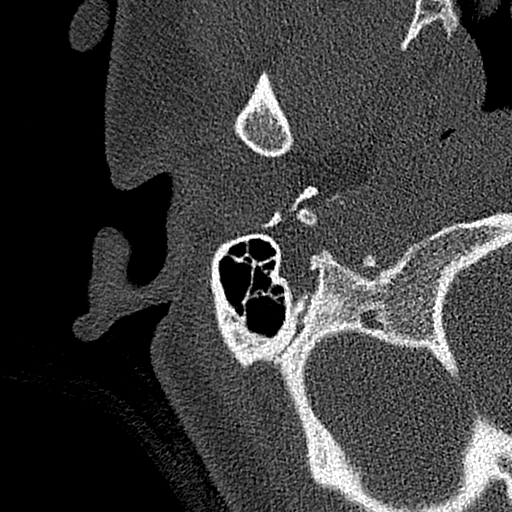
[im 46/92  bone]
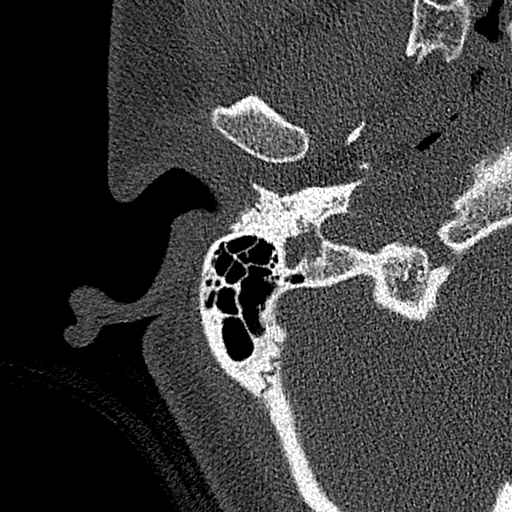
[im 54/92  bone]
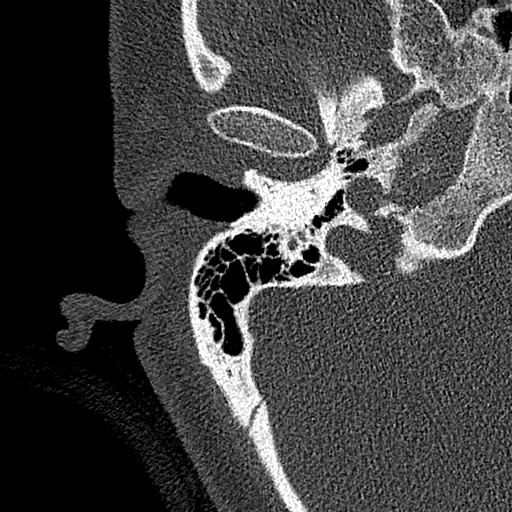
[im 61/92  bone]
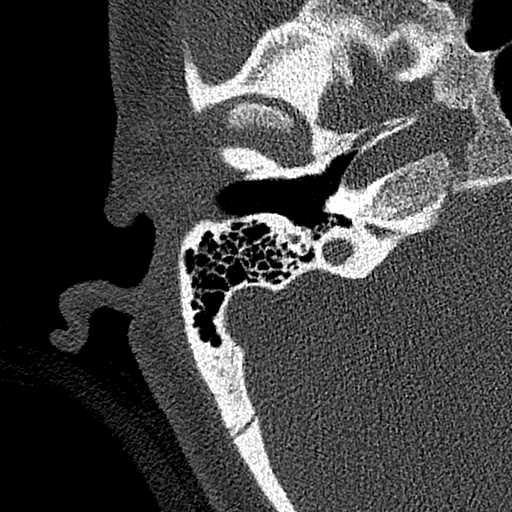
[im 69/92  brain]
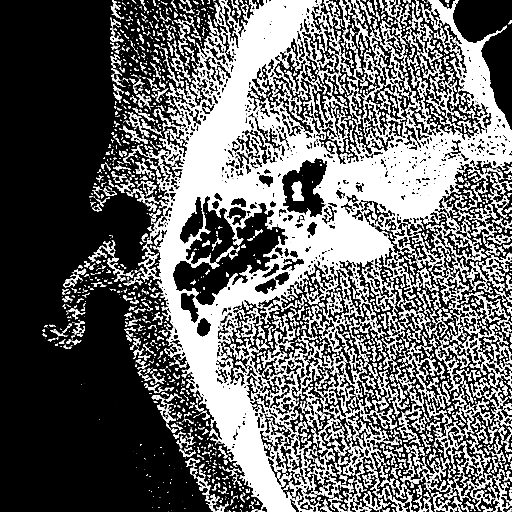
[im 69/92  bone]
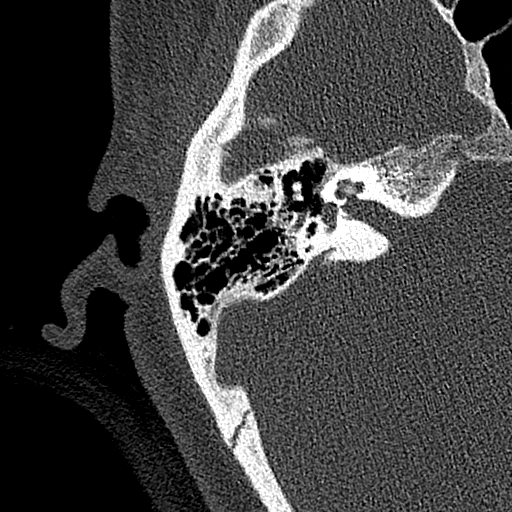
[im 76/92  bone]
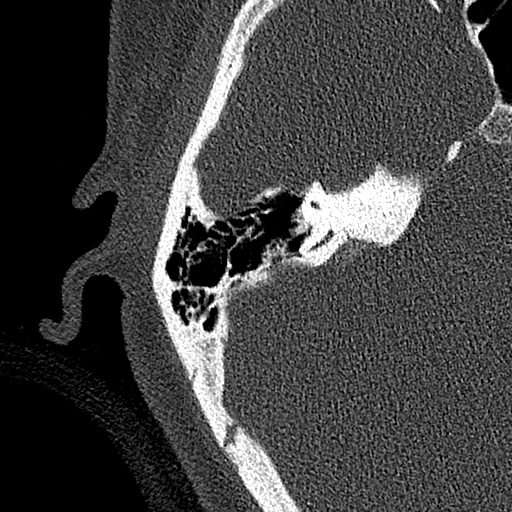
[im 84/92  bone]
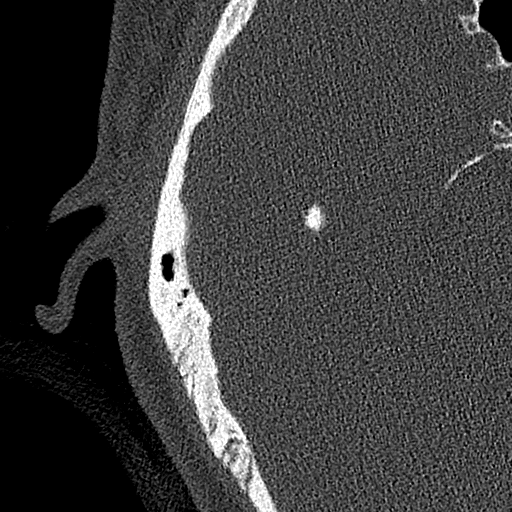

[13 of 30 positions shown; findings below may reference images not displayed]

FINDINGS: RIGHT TEMPORAL BONE

Normal right external auditory canal. Diminutive, normal right
tympanic membrane. Right scutum and ossicles appear intact and
aligned. Right tympanic cavity is clear. Right mastoid antrum and
air cells are clear.

Normal right internal auditory canal, cochlea, vestibule, and
vestibular aqueduct. Thinning of bone over the right superior
semicircular canal (series 8, image 66), but no convincing
dehiscence. Otherwise normal right semicircular canals. Normal
course of the right 7th nerve.

LEFT TEMPORAL BONE

Normal left EAC. Diminutive, normal left tympanic membrane. Left
scutum and ossicles are intact and normally aligned. Left tympanic
cavity is clear. Left mastoid antrum and air cells are clear.

Dominant left IJ bulb is mildly high riding with overlying bony
thinning at the hypo tympanum as seen on series 6, image 58 and
series 9, image 63. Sigmoid plate appears intact.

Left IAC, cochlea, vestibule and vestibular aqueduct are within
normal limits. Similar thinning of bone over the left superior
semicircular canal (series 9, image 64) without obvious dehiscence.
Otherwise negative left semicircular canals, course of the left 7th
nerve.

Vascular: No suspicious intracranial vascular hyperdensity.

Limited intracranial:  Negative.

Visible orbits/paranasal sinuses: Visualized orbit soft tissues are
within normal limits. Well aerated visible paranasal sinuses, with
mild to moderate mucosal thickening in the right maxillary alveolar
recess.

Soft tissues: Negative visible noncontrast scalp and deep soft
tissue spaces of the face. Superficial periauricular soft tissues
appears symmetric and normal.
IMPRESSION: 1. Normal CT appearance of both temporal bones aside from thinning
of bone overlying a mildly high riding left IJ bulb, and both
superior semicircular canals.

2. Right maxillary alveolar recess mucosal thickening.

## 2022-12-15 ENCOUNTER — Ambulatory Visit: Payer: Managed Care, Other (non HMO) | Admitting: Radiology

## 2022-12-15 ENCOUNTER — Encounter: Payer: Self-pay | Admitting: Radiology

## 2022-12-15 VITALS — BP 104/70 | HR 72 | Resp 16

## 2022-12-15 DIAGNOSIS — N912 Amenorrhea, unspecified: Secondary | ICD-10-CM

## 2022-12-15 LAB — PREGNANCY, URINE: Preg Test, Ur: NEGATIVE

## 2022-12-15 NOTE — Progress Notes (Signed)
   Tonya Hill 03-29-1991 [id number]   History:  31 y.o. G1P0 presents with c/o 1 week late for menses with no signs of impending cycle. Has some RLQ tenderness x 1 week. Trying for pregnancy x 3 years.  Gynecologic History Patient's last menstrual period was 11/08/2022 (exact date). Period Duration (Days): 5-6 Period Pattern: Regular Menstrual Flow:  (heavy for 1st day then light flow) Menstrual Control: Tampon Dysmenorrhea: (!) Moderate Dysmenorrhea Symptoms: Cramping Contraception/Family planning: none Sexually active: yes Last Pap: 2020. Results were: normal  Obstetric History OB History  Gravida Para Term Preterm AB Living  1 0 0 0 1 0  SAB IAB Ectopic Multiple Live Births  1 0 0        # Outcome Date GA Lbr Len/2nd Weight Sex Delivery Anes PTL Lv  1 SAB         FD     The following portions of the patient's history were reviewed and updated as appropriate: allergies, current medications, past family history, past medical history, past social history, past surgical history, and problem list.  Review of Systems Pertinent items noted in HPI and remainder of comprehensive ROS otherwise negative.   Past medical history, past surgical history, family history and social history were all reviewed and documented in the EPIC chart.   Exam:  Vitals:   12/15/22 0832  BP: 104/70  Pulse: 72  Resp: 16   There is no height or weight on file to calculate BMI.  General appearance:  Normal  Abdominal  Soft,nontender, without masses, guarding or rebound.  Liver/spleen:  No organomegaly noted  Hernia:  None appreciated Genitourinary   Inguinal/mons:  Normal without inguinal adenopathy  External genitalia:  Normal appearing vulva with no masses, tenderness, or lesions  BUS/Urethra/Skene's glands:  Normal without masses or exudate  Vagina:  Normal appearing with normal color and discharge, no lesions  Cervix:  Normal appearing without discharge or lesions  Uterus:  Normal  in size, shape and contour.  Mobile, nontender  Adnexa/parametria:     Rt: Normal in size, without masses + tenderness with deep palpation.   Lt: Normal in size, without masses or tenderness.  Anus and perineum: Normal   Patient informed chaperone available to be present for breast and pelvic exam. Patient has requested no chaperone to be present. Patient has been advised what will be completed during breast and pelvic exam.   Assessment/Plan:   1. Amenorrhea Trying for pregnancy - Pregnancy, urine; negative - B-HCG Quant   Overdue for annual and pap- needs to schedule  Arlie Solomons B WHNP-BC 9:11 AM 12/15/2022

## 2022-12-16 LAB — HCG, QUANTITATIVE, PREGNANCY: HCG, Total, QN: <5 m[IU]/mL

## 2023-01-10 IMAGING — US US ABDOMEN LIMITED
1 series · 14 of 25 positions shown · non-contrast
Comparison: None Available.

CLINICAL DATA: Right upper quadrant pain

EXAM:
ULTRASOUND ABDOMEN LIMITED RIGHT UPPER QUADRANT

[Series 1: us abdomen limited ruq (liver/gb) · 14 of 47 slices shown]
[im 1/47]
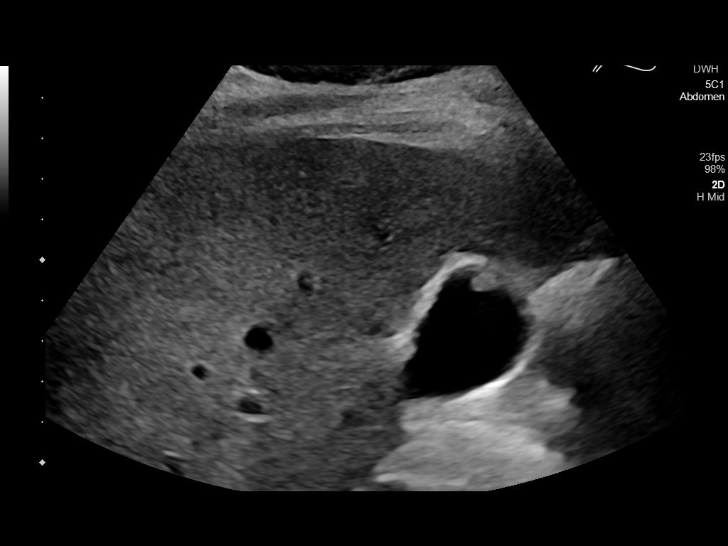
[im 4/47]
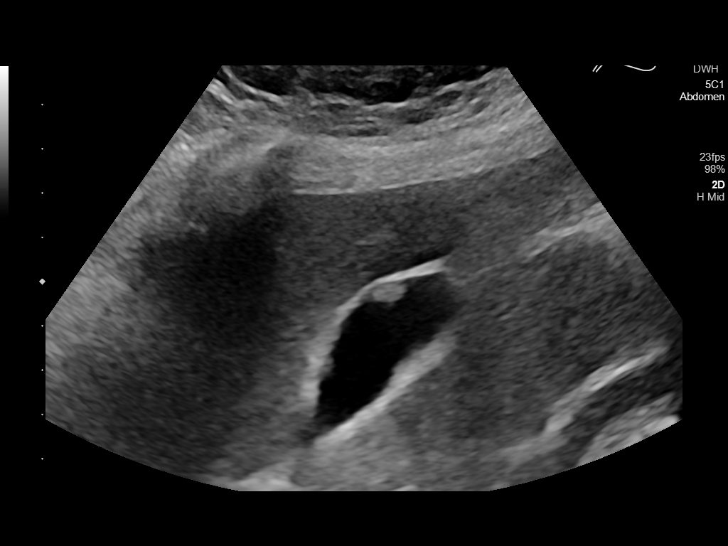
[im 8/47]
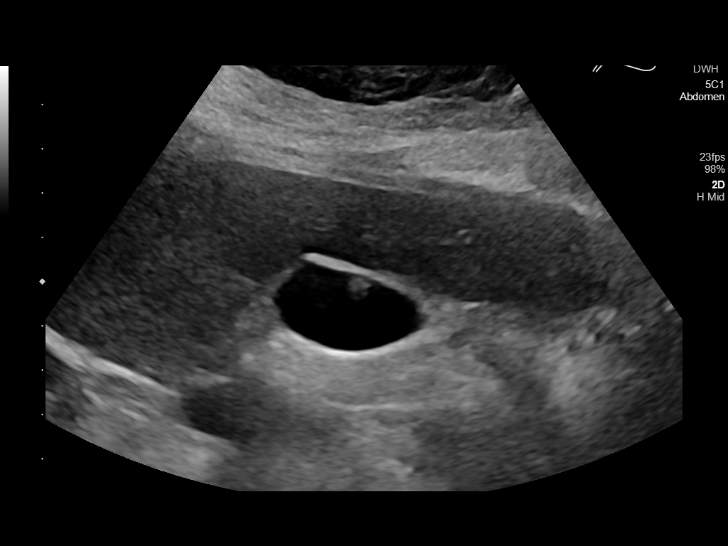
[im 12/47]
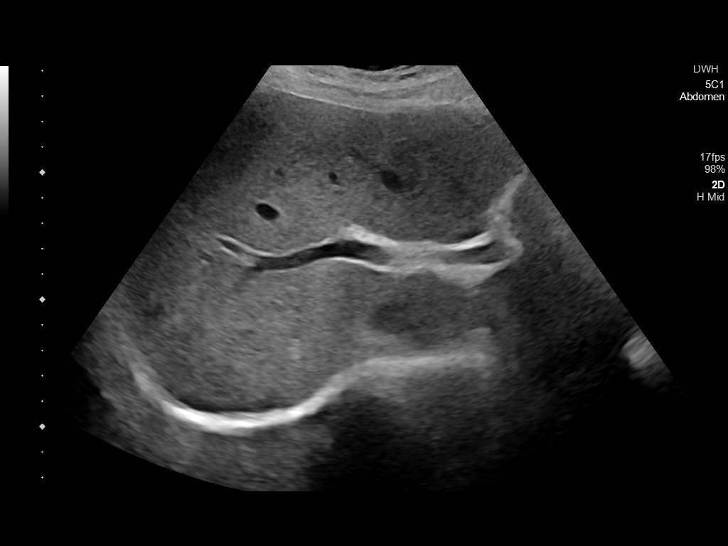
[im 16/47]
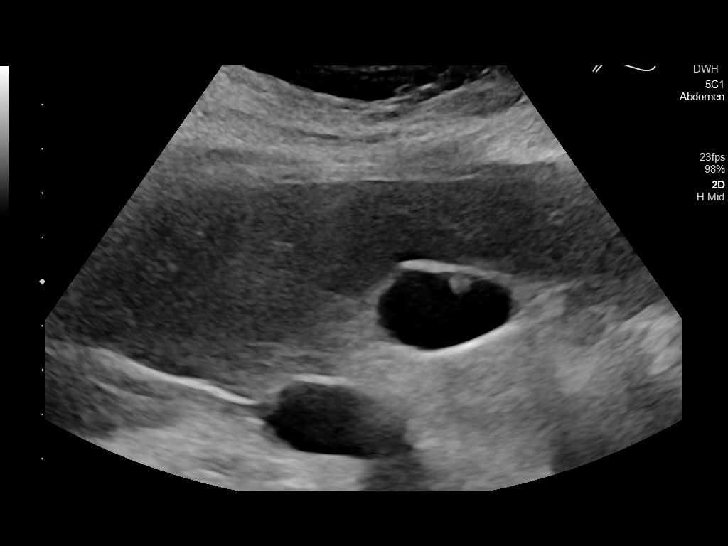
[im 18/47]
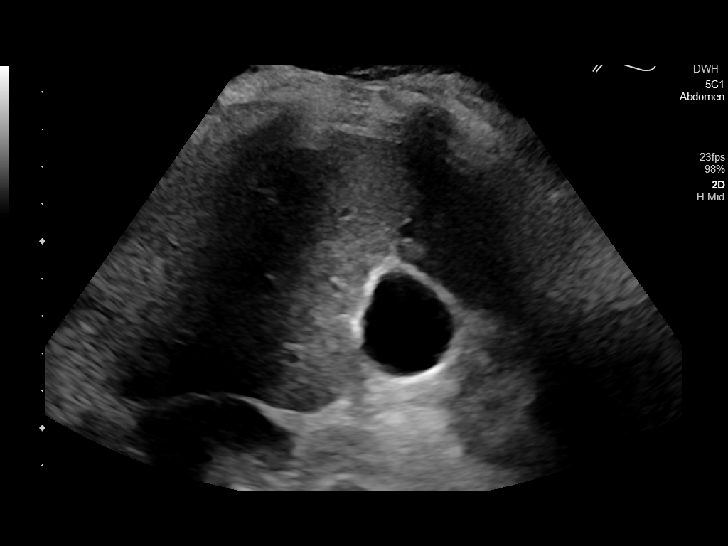
[im 22/47]
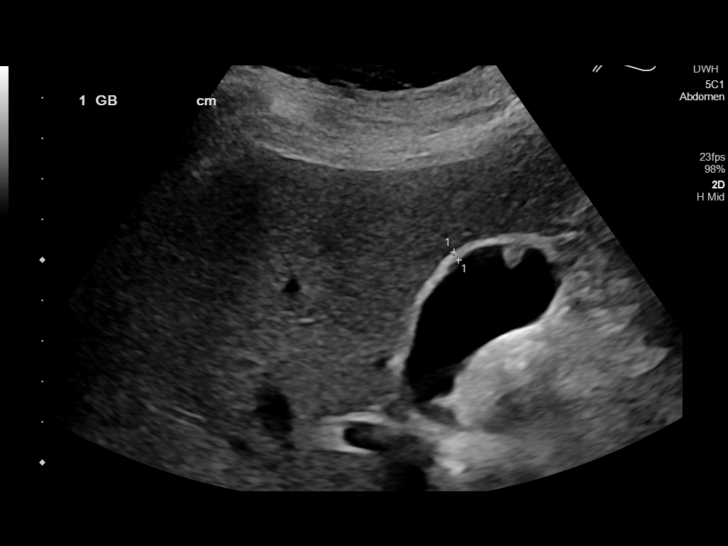
[im 25/47]
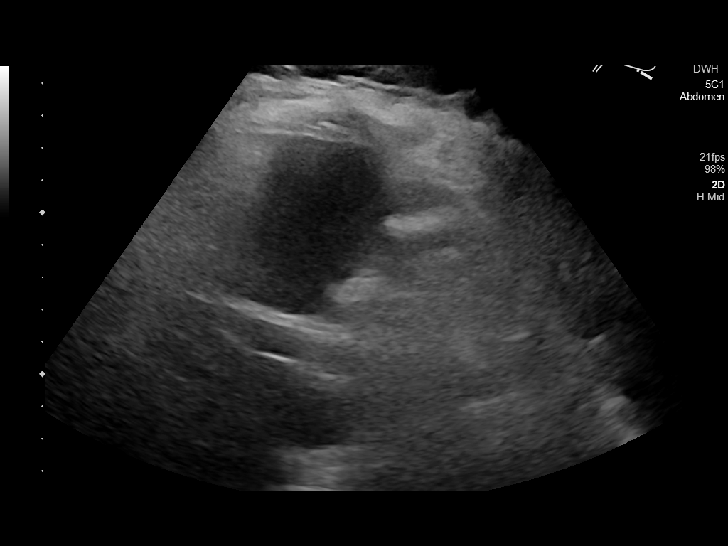
[im 29/47]
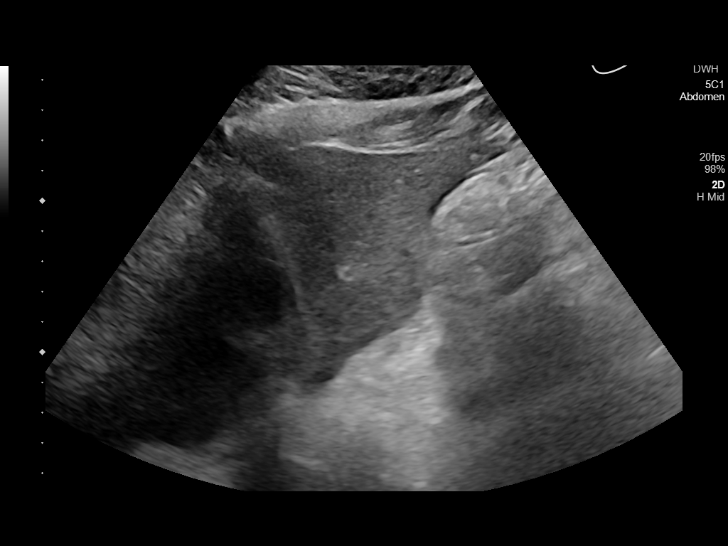
[im 31/47]
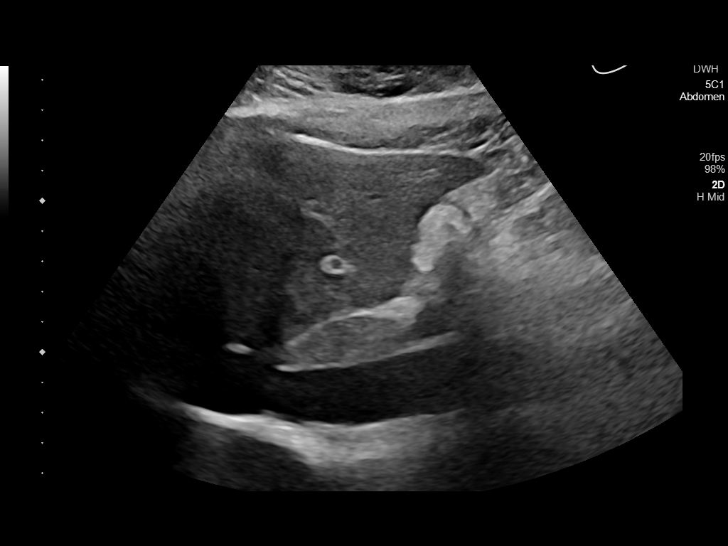
[im 35/47]
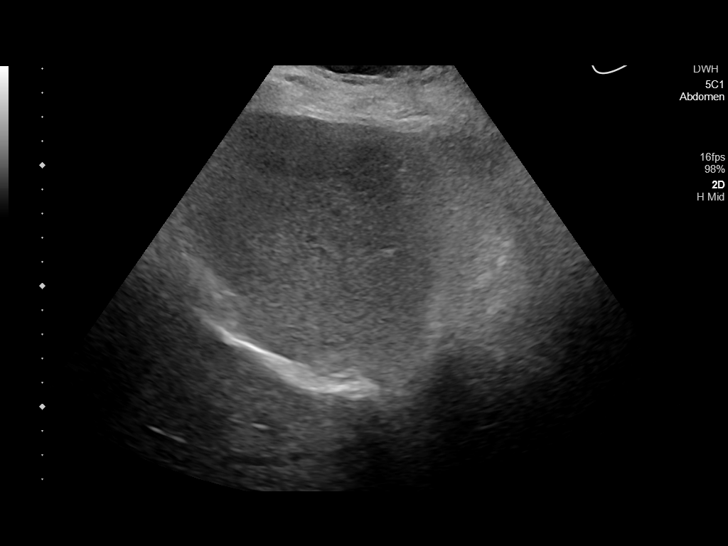
[im 39/47]
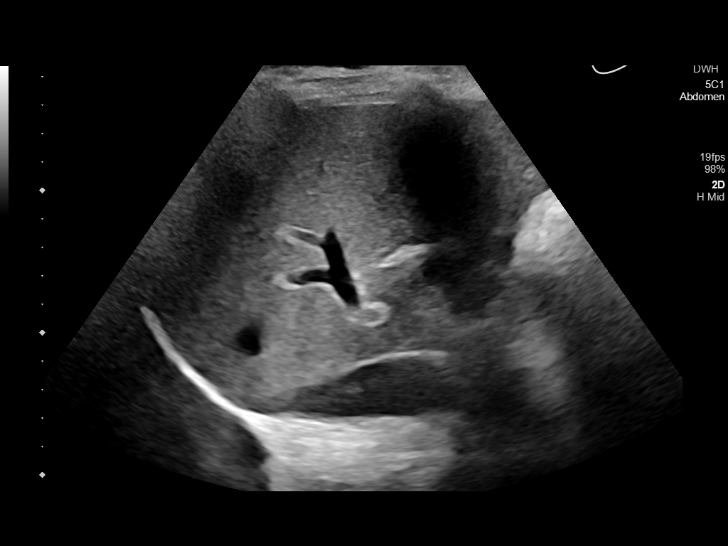
[im 43/47]
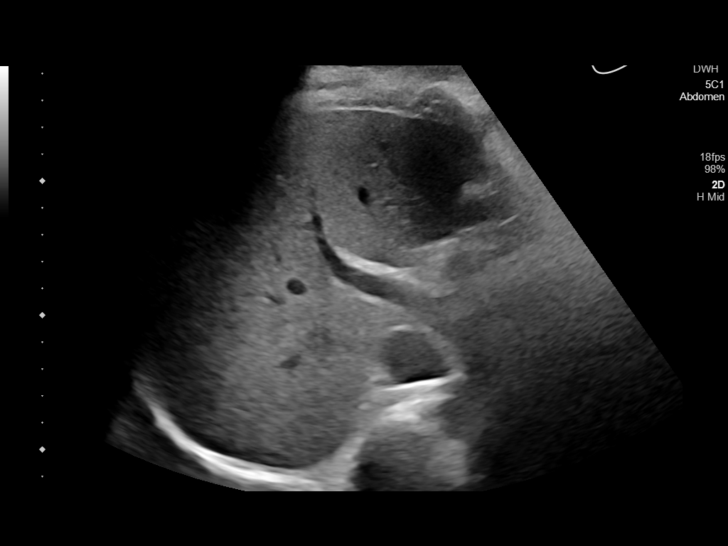
[im 47/47]
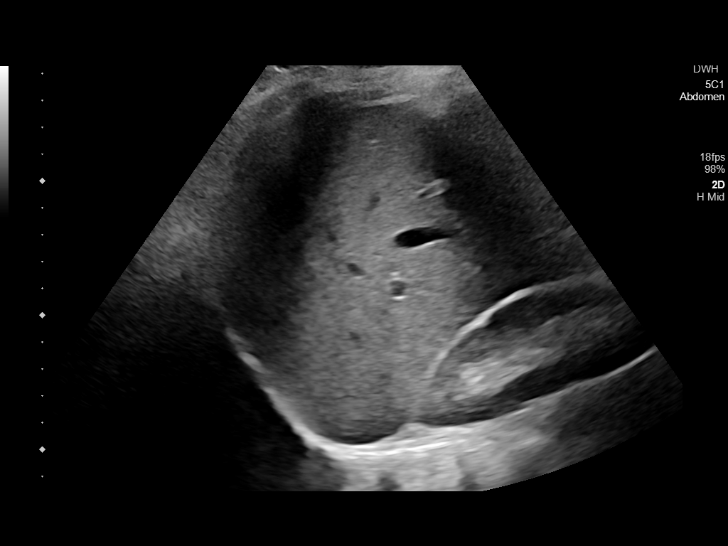

[14 of 25 positions shown; findings below may reference images not displayed]

FINDINGS: Gallbladder:

Non mobile nonshadowing 7 mm focus along the fundus compatible with
polyp. No stones or wall thickening. Negative sonographic Murphy
sign.

Common bile duct:

Diameter: Normal caliber, 2 mm.

Liver:

No focal lesion identified. Within normal limits in parenchymal
echogenicity. Portal vein is patent on color Doppler imaging with
normal direction of blood flow towards the liver.

Other: None.
IMPRESSION: 7 mm gallbladder wall polyp.

No evidence of cholelithiasis or acute cholecystitis.

No acute findings.

## 2023-01-29 DIAGNOSIS — K824 Cholesterolosis of gallbladder: Secondary | ICD-10-CM | POA: Insufficient documentation

## 2023-04-03 ENCOUNTER — Ambulatory Visit: Payer: Managed Care, Other (non HMO) | Admitting: Nurse Practitioner

## 2023-04-03 ENCOUNTER — Emergency Department (HOSPITAL_COMMUNITY)
Admission: EM | Admit: 2023-04-03 | Discharge: 2023-04-04 | Disposition: A | Discharge status: Home / Self Care | Payer: Managed Care, Other (non HMO) | Attending: Emergency Medicine | Admitting: Emergency Medicine

## 2023-04-03 ENCOUNTER — Encounter (HOSPITAL_COMMUNITY): Payer: Self-pay

## 2023-04-03 ENCOUNTER — Other Ambulatory Visit: Payer: Self-pay

## 2023-04-03 DIAGNOSIS — Z91040 Latex allergy status: Secondary | ICD-10-CM | POA: Diagnosis not present

## 2023-04-03 DIAGNOSIS — R1011 Right upper quadrant pain: Secondary | ICD-10-CM | POA: Diagnosis present

## 2023-04-03 DIAGNOSIS — K2900 Acute gastritis without bleeding: Secondary | ICD-10-CM | POA: Insufficient documentation

## 2023-04-03 LAB — BASIC METABOLIC PANEL
Anion gap: 7 (ref 5–15)
BUN: 11 mg/dL (ref 6–20)
CO2: 25 mmol/L (ref 22–32)
Calcium: 8.7 mg/dL — ABNORMAL LOW (ref 8.9–10.3)
Chloride: 104 mmol/L (ref 98–111)
Creatinine, Ser: 1.48 mg/dL — ABNORMAL HIGH (ref 0.44–1.00)
GFR, Estimated: 49 mL/min — ABNORMAL LOW (ref >60)
Glucose, Bld: 97 mg/dL (ref 70–99)
Potassium: 3.4 mmol/L — ABNORMAL LOW (ref 3.5–5.1)
Sodium: 136 mmol/L (ref 135–145)

## 2023-04-03 LAB — CBC WITH DIFFERENTIAL/PLATELET
Abs Immature Granulocytes: 0.03 10*3/uL (ref 0.00–0.07)
Basophils Absolute: 0.1 10*3/uL (ref 0.0–0.1)
Basophils Relative: 1 %
Eosinophils Absolute: 0 10*3/uL (ref 0.0–0.5)
Eosinophils Relative: 0 %
HCT: 40.4 % (ref 36.0–46.0)
Hemoglobin: 12.9 g/dL (ref 12.0–15.0)
Immature Granulocytes: 0 %
Lymphocytes Relative: 20 %
Lymphs Abs: 2.6 10*3/uL (ref 0.7–4.0)
MCH: 28.9 pg (ref 26.0–34.0)
MCHC: 31.9 g/dL (ref 30.0–36.0)
MCV: 90.6 fL (ref 80.0–100.0)
Monocytes Absolute: 0.8 10*3/uL (ref 0.1–1.0)
Monocytes Relative: 6 %
Neutro Abs: 9.5 10*3/uL — ABNORMAL HIGH (ref 1.7–7.7)
Neutrophils Relative %: 73 %
Platelets: 416 10*3/uL — ABNORMAL HIGH (ref 150–400)
RBC: 4.46 MIL/uL (ref 3.87–5.11)
RDW: 13 % (ref 11.5–15.5)
WBC: 13 10*3/uL — ABNORMAL HIGH (ref 4.0–10.5)
nRBC: 0 % (ref 0.0–0.2)

## 2023-04-03 LAB — LIPASE, BLOOD: Lipase: 26 U/L (ref 11–51)

## 2023-04-03 NOTE — ED Triage Notes (Signed)
Pt arrived via POV c/o right upper and lower GI pain. Pt reports Hx of endometriosis and has surgeries in the past to alleviate the pain, but pain has come back and Pt reports pain began to increase significantly after eating this evening. Pt reports multiple episodes of emesis following this. Pt denies any other symptoms. Pt reports trying OTC Tums for discomfort w/o relief.

## 2023-04-04 MED ORDER — HYOSCYAMINE SULFATE 0.125 MG SL SUBL
0.2500 mg | SUBLINGUAL_TABLET | Freq: Once | SUBLINGUAL | Status: AC
  Administered 2023-04-04: 0.25 mg via SUBLINGUAL
  Filled 2023-04-04: qty 2

## 2023-04-04 MED ORDER — FAMOTIDINE 20 MG PO TABS
40.0000 mg | ORAL_TABLET | Freq: Once | ORAL | Status: AC
  Administered 2023-04-04: 40 mg via ORAL
  Filled 2023-04-04: qty 2

## 2023-04-04 MED ORDER — ALUM & MAG HYDROXIDE-SIMETH 200-200-20 MG/5ML PO SUSP
30.0000 mL | Freq: Once | ORAL | Status: AC
  Administered 2023-04-04: 30 mL via ORAL
  Filled 2023-04-04: qty 30

## 2023-04-04 MED ORDER — HYOSCYAMINE SULFATE 0.125 MG SL SUBL: 0.1250 mg | SUBLINGUAL_TABLET | SUBLINGUAL | 0 refills | Status: AC | PRN

## 2023-04-04 MED ORDER — PANTOPRAZOLE SODIUM 40 MG PO TBEC: 40.0000 mg | DELAYED_RELEASE_TABLET | Freq: Every day | ORAL | 3 refills | Status: AC

## 2023-04-04 MED ORDER — HYDROCODONE-ACETAMINOPHEN 7.5-325 MG/15ML PO SOLN
10.0000 mL | Freq: Once | ORAL | Status: AC
  Administered 2023-04-04: 10 mL via ORAL
  Filled 2023-04-04: qty 15

## 2023-04-04 MED ORDER — SUCRALFATE 1 GM/10ML PO SUSP: 1.0000 g | Freq: Three times a day (TID) | ORAL | 0 refills | Status: AC

## 2023-04-04 NOTE — ED Provider Notes (Signed)
[place] EMERGENCY DEPARTMENT AT [facility] Provider Note   CSN: [id number] Arrival date & time: 04/03/23  1944     History  Chief Complaint  Patient presents with   Abdominal Pain    Tonya Hill is a 32 y.o. female.  Patient presents to the emergency department for evaluation of upper abdominal pain with nausea and vomiting.  Patient reports that the pain started in the right upper abdomen after eating.  Pain is now in the center of her abdomen and goes through into her back.  She has had similar pains in the past, has had her gallbladder worked up by gastroenterology.       Home Medications Prior to Admission medications   Medication Sig Start Date End Date Taking? Authorizing Provider  hyoscyamine (LEVSIN SL) 0.125 MG SL tablet Place 1 tablet (0.125 mg total) under the tongue every 4 (four) hours as needed for cramping (abdominal pain). 04/04/23  Yes Javaya [place], Canary Brimhristopher J, MD  pantoprazole (PROTONIX) 40 MG tablet Take 1 tablet (40 mg total) by mouth daily. 04/04/23  Yes Teila Skalsky, Canary Brimhristopher J, MD  sucralfate (CARAFATE) 1 GM/10ML suspension Take 10 mLs (1 g total) by mouth 4 (four) times daily -  with meals and at bedtime. 04/04/23  Yes Gilda CreasePollina, Emmanuell Kantz J, MD  amphetamine-dextroamphetamine (ADDERALL XR) 20 MG 24 hr capsule Take by mouth. 10/09/21 12/15/22  [provider]  amphetamine-dextroamphetamine (ADDERALL) 10 MG tablet Take 10 mg by mouth daily.    [provider]  buPROPion (WELLBUTRIN XL) 150 MG 24 hr tablet Take 300 mg by mouth.  01/15/20   [provider]  Prenatal Vit-Fe Fumarate-FA (MULTIVITAMIN-PRENATAL) 27-0.8 MG TABS tablet Take 1 tablet by mouth daily at 12 noon.    [provider]  sertraline (ZOLOFT) 25 MG tablet Take 25 mg by mouth daily. 10/13/21   [provider]  levonorgestrel (MIRENA) 20 MCG/24HR IUD by Intrauterine route.  08/02/20  [provider]  phentermine (ADIPEX-P) 37.5 MG tablet  Take by mouth. 06/02/19 12/28/19  [provider]      Allergies    Sulfa antibiotics, Doxycycline, Latex, Omnicef [cefdinir], and Tape    Review of Systems   Review of Systems  Physical Exam Updated Vital Signs BP 132/77   Pulse 69   Temp 98.3 F (36.8 C) (Oral)   Resp 18   Ht 5' (1.524 m)   Wt 98 kg   LMP 03/24/2023 (Exact Date)   SpO2 100%   BMI 42.19 kg/m  Physical Exam Vitals and nursing note reviewed.  Constitutional:      General: She is not in acute distress.    Appearance: She is well-developed.  HENT:     Head: Normocephalic and atraumatic.     Mouth/Throat:     Mouth: Mucous membranes are moist.  Eyes:     General: Vision grossly intact. Gaze aligned appropriately.     Extraocular Movements: Extraocular movements intact.     Conjunctiva/sclera: Conjunctivae normal.  Cardiovascular:     Rate and Rhythm: Normal rate and regular rhythm.     Pulses: Normal pulses.     Heart sounds: Normal heart sounds, S1 normal and S2 normal. No murmur heard.    No friction rub. No gallop.  Pulmonary:     Effort: Pulmonary effort is normal. No respiratory distress.     Breath sounds: Normal breath sounds.  Abdominal:     General: Bowel sounds are normal.     Palpations: Abdomen is  soft.     Tenderness: There is abdominal tenderness in the epigastric area. There is no guarding or rebound.     Hernia: No hernia is present.  Musculoskeletal:        General: No swelling.     Cervical back: Full passive range of motion without pain, normal range of motion and neck supple. No spinous process tenderness or muscular tenderness. Normal range of motion.     Right lower leg: No edema.     Left lower leg: No edema.  Skin:    General: Skin is warm and dry.     Capillary Refill: Capillary refill takes less than 2 seconds.     Findings: No ecchymosis, erythema, rash or wound.  Neurological:     General: No focal deficit present.     Mental Status: She is alert and oriented to  person, place, and time.     GCS: GCS eye subscore is 4. GCS verbal subscore is 5. GCS motor subscore is 6.     Cranial Nerves: Cranial nerves 2-12 are intact.     Sensory: Sensation is intact.     Motor: Motor function is intact.     Coordination: Coordination is intact.  Psychiatric:        Attention and Perception: Attention normal.        Mood and Affect: Mood normal.        Speech: Speech normal.        Behavior: Behavior normal.     ED Results / Procedures / Treatments   Labs (all labs ordered are listed, but only abnormal results are displayed) Labs Reviewed  CBC WITH DIFFERENTIAL/PLATELET - Abnormal; Notable for the following components:      Result Value   WBC 13.0 (*)    Platelets 416 (*)    Neutro Abs 9.5 (*)    All other components within normal limits  BASIC METABOLIC PANEL - Abnormal; Notable for the following components:   Potassium 3.4 (*)    Creatinine, Ser 1.48 (*)    Calcium 8.7 (*)    GFR, Estimated 49 (*)    All other components within normal limits  LIPASE, BLOOD    EKG None  Radiology No results found.  Procedures Procedures    Medications Ordered in ED Medications  alum & mag hydroxide-simeth (MAALOX/MYLANTA) 200-200-20 MG/5ML suspension 30 mL (has no administration in time range)  hyoscyamine (LEVSIN SL) SL tablet 0.25 mg (has no administration in time range)  HYDROcodone-acetaminophen (HYCET) 7.5-325 mg/15 ml solution 10 mL (has no administration in time range)  famotidine (PEPCID) tablet 40 mg (has no administration in time range)    ED Course/ Medical Decision Making/ A&P                             Medical Decision Making Amount and/or Complexity of Data Reviewed External Data Reviewed: labs, radiology and notes. Labs: ordered. Decision-making details documented in ED Course.  Risk OTC drugs. Prescription drug management.   Differential Diagnosis considered includes, but not limited to: Cholelithiasis; cholecystitis;  cholangitis; bowel obstruction; esophagitis; gastritis; peptic ulcer disease; pancreatitis; cardiac.   Patient presents to the emergency department for evaluation of postprandial upper abdominal pain.  Patient has had similar symptoms in the past.  She does have a history of endometriosis, but in the past this pain has been thought to maybe be her gallbladder.  Reviewing her records, however, revealed that she has had  a thorough workup.  She had a CT scan and ultrasound in the ER at [facility] regional for prior similar symptoms.  The only finding at that time was probable polyp in the gallbladder that was nonobstructive.  Patient did follow-up with GI and had a HIDA scan that was entirely normal.  Based on her prior workup, I do not feel that the patient is likely experiencing pain secondary to gallbladder disease.  Her abdominal exam is benign.  I do not feel that repeat imaging would be helpful at this time.  Lab work is reassuring.  I suggest that we treat her for possible gastritis versus peptic ulcer disease.  No anemia or signs of bleeding.  Refer her back to her GI doctor for further workup of upper abdominal pain.  She does not have any indication of need for hospitalization or acute surgical process.        Final Clinical Impression(s) / ED Diagnoses Final diagnoses:  Acute gastritis without hemorrhage, unspecified gastritis type    Rx / DC Orders ED Discharge Orders          Ordered    pantoprazole (PROTONIX) 40 MG tablet  Daily        04/04/23 0052    hyoscyamine (LEVSIN SL) 0.125 MG SL tablet  Every 4 hours PRN        04/04/23 0052    sucralfate (CARAFATE) 1 GM/10ML suspension  3 times daily with meals & bedtime        04/04/23 0052              Gilda Crease, MD 04/04/23 [phone]

## 2023-04-12 ENCOUNTER — Encounter: Payer: Self-pay | Admitting: Nurse Practitioner

## 2023-04-12 ENCOUNTER — Ambulatory Visit (INDEPENDENT_AMBULATORY_CARE_PROVIDER_SITE_OTHER): Payer: Managed Care, Other (non HMO) | Admitting: Nurse Practitioner

## 2023-04-12 VITALS — BP 112/70 | HR 112

## 2023-04-12 DIAGNOSIS — Z30011 Encounter for initial prescription of contraceptive pills: Secondary | ICD-10-CM | POA: Diagnosis not present

## 2023-04-12 DIAGNOSIS — Z8742 Personal history of other diseases of the female genital tract: Secondary | ICD-10-CM

## 2023-04-12 DIAGNOSIS — N809 Endometriosis, unspecified: Secondary | ICD-10-CM

## 2023-04-12 DIAGNOSIS — R1031 Right lower quadrant pain: Secondary | ICD-10-CM | POA: Diagnosis not present

## 2023-04-12 DIAGNOSIS — R350 Frequency of micturition: Secondary | ICD-10-CM

## 2023-04-12 MED ORDER — NORETHIN ACE-ETH ESTRAD-FE 1-20 MG-MCG PO TABS: 1.0000 | ORAL_TABLET | Freq: Every day | ORAL | 0 refills | Status: DC

## 2023-04-12 NOTE — Progress Notes (Signed)
   Acute Office Visit  Subjective:    Patient ID: Tonya Hill, female    DOB: 02/17/1991, 32 y.o.   MRN: [id number]   HPI 32 y.o. G1P0010 presents today for RLQ pain x 4-5 months. Pain is intermittent, sharp, not only with menses and ovulation but occurs more frequently now. Feels she can pinpoint exactly where pain is. LMP 3/30/204. Denies changes in bowels or vaginal symptoms. Does report mild urinary frequency without dysuria, urgency or hematuria. H/O endometriosis confirmed by laparoscopy in 2016 and ovarian cysts. Would like to restart birth control. Interested in OCPs.   Patient's last menstrual period was 03/24/2023 (exact date).    Review of Systems  Constitutional: Negative.   Gastrointestinal:  Positive for abdominal pain. Negative for abdominal distention, constipation, diarrhea and vomiting.  Genitourinary:  Positive for frequency and pelvic pain. Negative for dysuria, flank pain, hematuria, urgency, vaginal discharge and vaginal pain.       Objective:    Physical Exam Constitutional:      Appearance: Normal appearance.  Abdominal:     Tenderness: There is abdominal tenderness in the right lower quadrant.  Genitourinary:    General: Normal vulva.     Vagina: Normal.     Cervix: Normal.     Uterus: Normal.      Adnexa: Left adnexa normal.       Right: Tenderness present.      BP 112/70   Pulse (!) 112   LMP 03/24/2023 (Exact Date)   SpO2 97%  Wt Readings from Last 3 Encounters:  04/03/23 216 lb 0.8 oz (98 kg)  07/18/22 215 lb (97.5 kg)  05/23/22 215 lb (97.5 kg)        Patient informed chaperone available to be present for breast and/or pelvic exam. Patient has requested no chaperone to be present. Patient has been advised what will be completed during breast and pelvic exam.   UA negative  Assessment & Plan:   Problem List Items Addressed This Visit   None Visit Diagnoses     Right lower quadrant pain    -  Primary   Relevant Orders   US  PELVIS TRANSVAGINAL NON-OB (TV ONLY)   Frequency of urination       Relevant Orders   Urinalysis,Complete w/RFL Culture (Completed)   History of endometriosis       Relevant Orders   US PELVIS TRANSVAGINAL NON-OB (TV ONLY)   History of ovarian cyst       Relevant Orders   US PELVIS TRANSVAGINAL NON-OB (TV ONLY)   Encounter for initial prescription of contraceptive pills       Relevant Medications   norethindrone-ethinyl estradiol-FE (JUNEL FE 1/20) 1-20 MG-MCG tablet      Plan: Will schedule pelvic ultrasound. Will start COCs with first day of next cycle. UA negative.      Olivia Mackie DNP, 4:26 PM 04/12/2023

## 2023-04-13 LAB — CULTURE INDICATED

## 2023-04-13 LAB — URINALYSIS, COMPLETE W/RFL CULTURE
Bilirubin Urine: NEGATIVE
Glucose, UA: NEGATIVE
Hyaline Cast: NONE SEEN /LPF
Ketones, ur: NEGATIVE
Nitrites, Initial: NEGATIVE
Protein, ur: NEGATIVE
Specific Gravity, Urine: 1.015 (ref 1.001–1.035)
pH: 5.5 (ref 5.0–8.0)

## 2023-04-13 LAB — URINE CULTURE
MICRO NUMBER:: 14843084
Result:: NO GROWTH
SPECIMEN QUALITY:: ADEQUATE

## 2023-04-19 ENCOUNTER — Other Ambulatory Visit: Payer: Managed Care, Other (non HMO)

## 2023-04-19 ENCOUNTER — Telehealth: Payer: Self-pay

## 2023-04-19 NOTE — Telephone Encounter (Signed)
Lillia Carmel, CMA  P [facility] Triage Patient cancelled upcoming ultrasound, does not want to reschedule.

## 2023-04-19 NOTE — Telephone Encounter (Signed)
I see her in June for an annual exam. I will see how she is doing then and reschedule if appropriate.

## 2023-04-24 ENCOUNTER — Ambulatory Visit: Payer: Managed Care, Other (non HMO) | Admitting: Nurse Practitioner

## 2023-05-08 ENCOUNTER — Ambulatory Visit: Payer: Managed Care, Other (non HMO)

## 2023-05-08 DIAGNOSIS — K2960 Other gastritis without bleeding: Secondary | ICD-10-CM | POA: Diagnosis not present

## 2023-06-13 ENCOUNTER — Ambulatory Visit: Payer: Managed Care, Other (non HMO) | Admitting: Nurse Practitioner

## 2023-06-27 ENCOUNTER — Other Ambulatory Visit: Payer: Self-pay | Admitting: Nurse Practitioner

## 2023-06-27 DIAGNOSIS — Z30011 Encounter for initial prescription of contraceptive pills: Secondary | ICD-10-CM

## 2023-06-27 NOTE — Telephone Encounter (Signed)
Medication refill request: Junel  Last AEX:  10/24/21 ov 04/12/23  Next AEX: 08/29/23  Last MMG (if hormonal medication request): n/a Refill authorized: #[id number] with 0 RF pended for today

## 2023-08-29 ENCOUNTER — Ambulatory Visit: Payer: Managed Care, Other (non HMO) | Admitting: Nurse Practitioner

## 2023-08-29 NOTE — Progress Notes (Deleted)
   Tonya Hill 1991/01/05 [id number]   History:  32 y.o. G1P0010 presents for annual exam. Monthly cycles. Normal pap history. Gardasil series completed. 2013 endometriosis confirmed by laparoscopy. Has been trying to conceive for 2 years. Saw Dr. Jeannie Fend at [facility] 2022.   Gynecologic History No LMP recorded.   Contraception/Family planning: none Sexually active: Yes  Health Maintenance Last Pap: 10/15/2019. Results were: Normal, 3-year repeat Last mammogram: Not indicated Last colonoscopy: 05/18/2023 Last Dexa: Not indicated  Past medical history, past surgical history, family history and social history were all reviewed and documented in the EPIC chart. Married. 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 Husband works in Holiday representative.   ROS:  A ROS was performed and pertinent positives and negatives are included.  Exam:  There were no vitals filed for this visit.  There is no height or weight on file to calculate BMI.  General appearance:  Normal Thyroid:  Symmetrical, normal in size, without palpable masses or nodularity. Respiratory  Auscultation:  Clear without wheezing or rhonchi Cardiovascular  Auscultation:  Regular rate, without rubs, murmurs or gallops  Edema/varicosities:  Not grossly evident Abdominal  Soft,tender to RLQ, without masses, guarding or rebound.  Liver/spleen:  No organomegaly noted  Hernia:  None appreciated  Skin  Inspection:  Grossly normal Breasts: Examined lying and sitting.   Right: Without masses, retractions, nipple discharge or axillary adenopathy.   Left: Without masses, retractions, nipple discharge or axillary adenopathy. Genitourinary   Inguinal/mons:  Normal without inguinal adenopathy  External genitalia:  Normal appearing vulva with no masses, tenderness, or lesions  BUS/Urethra/Skene's glands:  Normal  Vagina:  Normal appearing with normal color and discharge, no lesions  Cervix:  Normal  appearing without discharge or lesions  Uterus:  Normal in size, shape and contour.  Midline and mobile, nontender  Adnexa/parametria:     Rt: Normal in size, without masses or tenderness.   Lt: Normal in size, without masses or tenderness.  Anus and perineum: Normal  Patient informed chaperone available to be present for breast and pelvic exam. Patient has requested no chaperone to be present. Patient has been advised what will be completed during breast and pelvic exam.   Assessment/Plan:  32 y.o. G1P0010 for annual exam.   Well female exam with routine gynecological exam - Education provided on SBEs, importance of preventative screenings, current guidelines, high calcium diet, regular exercise, and multivitamin daily. Labs with PCP.   Screening for cervical cancer - Normal pap history. Will repeat at 3-year interval per guidelines.   Return in 1 year for annual.   Olivia Mackie DNP, 7:58 AM 08/29/2023

## 2023-09-22 ENCOUNTER — Other Ambulatory Visit: Payer: Self-pay | Admitting: Nurse Practitioner

## 2023-09-22 DIAGNOSIS — Z30011 Encounter for initial prescription of contraceptive pills: Secondary | ICD-10-CM

## 2023-09-24 NOTE — Telephone Encounter (Signed)
Needs annual exam for future refills. Canceled and no showed last 2 appointments. Thanks.

## 2023-09-24 NOTE — Telephone Encounter (Signed)
Sent message to front to schedule for AEX

## 2023-09-24 NOTE — Telephone Encounter (Signed)
Med refill request: Junel Fe Last AEX: 08/29/23 Next AEX: not scheduled Last MMG (if hormonal med) n/a Refill authorized: Please Advise, #[id number], 0 RF

## 2023-10-29 ENCOUNTER — Other Ambulatory Visit: Payer: Self-pay | Admitting: Nurse Practitioner

## 2023-10-29 DIAGNOSIS — Z30011 Encounter for initial prescription of contraceptive pills: Secondary | ICD-10-CM

## 2023-10-29 NOTE — Telephone Encounter (Signed)
Med refill request: Junel Fe Last AEX: 10/24/21, OV 04/12/23 Next AEX: NO SHOW 08/29/23 Last MMG (if hormonal med) n/a Refill authorized: Please Advise? Last request 06/2023 was for #[id number] with 0 RF pended

## 2023-12-07 ENCOUNTER — Other Ambulatory Visit: Payer: Self-pay | Admitting: Otolaryngology

## 2023-12-07 DIAGNOSIS — R221 Localized swelling, mass and lump, neck: Secondary | ICD-10-CM

## 2023-12-09 ENCOUNTER — Emergency Department: Payer: Managed Care, Other (non HMO)

## 2023-12-09 ENCOUNTER — Other Ambulatory Visit: Payer: Self-pay

## 2023-12-09 DIAGNOSIS — K047 Periapical abscess without sinus: Secondary | ICD-10-CM | POA: Insufficient documentation

## 2023-12-09 DIAGNOSIS — R220 Localized swelling, mass and lump, head: Secondary | ICD-10-CM | POA: Diagnosis present

## 2023-12-09 DIAGNOSIS — L03211 Cellulitis of face: Secondary | ICD-10-CM | POA: Diagnosis not present

## 2023-12-09 NOTE — ED Triage Notes (Signed)
Pt reports swelling to her left lower face for 6 months, pt followed up with ENT recently and was told possible infection in lymph nodes or mass. Pt states ENT scheduled [place] for next week. Pt is now reporting swelling has gotten worse and she is having pain when swallowing. Pt reports cough.

## 2023-12-09 NOTE — ED Notes (Signed)
2 failed attempts to get blood work at this time, pt requesting to try again later.

## 2023-12-10 ENCOUNTER — Emergency Department
Admission: EM | Admit: 2023-12-10 | Discharge: 2023-12-10 | Disposition: A | Discharge status: Home / Self Care | Payer: Managed Care, Other (non HMO) | Attending: Emergency Medicine | Admitting: Emergency Medicine

## 2023-12-10 DIAGNOSIS — K122 Cellulitis and abscess of mouth: Secondary | ICD-10-CM | POA: Diagnosis not present

## 2023-12-10 DIAGNOSIS — L03211 Cellulitis of face: Secondary | ICD-10-CM

## 2023-12-10 DIAGNOSIS — K047 Periapical abscess without sinus: Secondary | ICD-10-CM

## 2023-12-10 DIAGNOSIS — R220 Localized swelling, mass and lump, head: Secondary | ICD-10-CM

## 2023-12-10 MED ORDER — AMOXICILLIN-POT CLAVULANATE 875-125 MG PO TABS: 1.0000 | ORAL_TABLET | Freq: Two times a day (BID) | ORAL | 0 refills | Status: DC

## 2023-12-10 MED ORDER — AMOXICILLIN-POT CLAVULANATE 875-125 MG PO TABS
1.0000 | ORAL_TABLET | Freq: Once | ORAL | Status: AC
  Administered 2023-12-10: 1 via ORAL
  Filled 2023-12-10: qty 1

## 2023-12-10 NOTE — ED Provider Notes (Signed)
[facility] Provider Note    Event Date/Time   First MD Initiated Contact with Patient 12/10/23 0024     (approximate)   History   Facial Swelling   HPI  Tonya Hill is a 32 y.o. female who presents to the ED for evaluation of Facial Swelling   Review of PCP visit from 2 months ago.  Foot injury after falling through floor event.  Morbidly obese  Patient presents with left-sided mandibular swelling.  Reports some intermittent swelling on a chronic timeframe, but acutely worsening the past couple days.  Saw ENT 4 days ago and has an outpatient ultrasound scheduled for tomorrow..  No fevers or systemic symptoms   Physical Exam   Triage Vital Signs: ED Triage Vitals  Encounter Vitals Group     BP 12/09/23 2043 (!) 149/98     Systolic BP Percentile --      Diastolic BP Percentile --      Pulse Rate 12/09/23 2043 99     Resp 12/09/23 2043 20     Temp 12/09/23 2043 98.4 F (36.9 C)     Temp src --      SpO2 12/09/23 2043 100 %     Weight 12/09/23 2043 220 lb (99.8 kg)     Height 12/09/23 2043 5' (1.524 m)     Head Circumference --      Peak Flow --      Pain Score 12/09/23 2042 6     Pain Loc --      Pain Education --      Exclude from Growth Chart --     Most recent vital signs: Vitals:   12/09/23 2043  BP: (!) 149/98  Pulse: 99  Resp: 20  Temp: 98.4 F (36.9 C)  SpO2: 100%    General: Awake, no distress.  Conversational in full sentences with a normal voice CV:  Good peripheral perfusion.  Resp:  Normal effort.  Abd:  No distention.  MSK:  No deformity noted.  Neuro:  No focal deficits appreciated. Other:  Mild swelling and cellulitic features to left sided mandible.  Able to open her mouth widely without signs of mucosal abscess or periodontal abscess.  No signs of upper airway obstruction or PTA.   ED Results / Procedures / Treatments   Labs (all labs ordered are listed, but only abnormal results are displayed) Labs  Reviewed - No data to display   EKG   RADIOLOGY CT head interpreted by me without evidence of acute intracranial pathology CT maxillofacial interpreted by me without abscess  Official radiology report(s): CT Maxillofacial Wo Contrast Result Date: 12/09/2023 CLINICAL DATA:  Facial swelling; Facial trauma, blunt. EXAM: CT HEAD WITHOUT CONTRAST CT MAXILLOFACIAL WITHOUT CONTRAST TECHNIQUE: Multidetector CT imaging of the head and maxillofacial structures were performed using the standard protocol without intravenous contrast. Multiplanar CT image reconstructions of the maxillofacial structures were also generated. RADIATION DOSE REDUCTION: This exam was performed according to the departmental dose-optimization program which includes automated exposure control, adjustment of the mA and/or kV according to patient size and/or use of iterative reconstruction technique. COMPARISON:  None Available. FINDINGS: CT HEAD FINDINGS Brain: No acute intracranial hemorrhage. Gray-white differentiation is preserved. No hydrocephalus or extra-axial collection. No mass effect or midline shift. Vascular: No hyperdense vessel or unexpected calcification. Skull: No calvarial fracture or suspicious bone lesion. Skull base is unremarkable. Other: None. CT MAXILLOFACIAL FINDINGS Osseous: Periapical lucency of the left mandibular second molar with medial cortical  breakthrough. Extensive dental caries of the same tooth as well as the right maxillary second molar. Orbits: Negative. No traumatic or inflammatory finding. Sinuses: Well aerated. Soft tissues: Enlarged left submandibular lymph nodes with adjacent thickening of the platysma and extensive surrounding inflammatory fat stranding, consistent with odontogenic cellulitis. IMPRESSION: 1. No acute intracranial abnormality. 2. Periapical lucency of the left mandibular second molar with medial cortical breakthrough and extensive dental caries of the same tooth as well as the right  maxillary second molar. 3. Enlarged left submandibular lymph nodes with adjacent thickening of the platysma and extensive surrounding inflammatory fat stranding, consistent with odontogenic cellulitis. Electronically Signed   By: Orvan Falconer M.D.   On: 12/09/2023 21:21   CT HEAD WO CONTRAST ( ) Result Date: 12/09/2023 CLINICAL DATA:  Facial swelling; Facial trauma, blunt. EXAM: CT HEAD WITHOUT CONTRAST CT MAXILLOFACIAL WITHOUT CONTRAST TECHNIQUE: Multidetector CT imaging of the head and maxillofacial structures were performed using the standard protocol without intravenous contrast. Multiplanar CT image reconstructions of the maxillofacial structures were also generated. RADIATION DOSE REDUCTION: This exam was performed according to the departmental dose-optimization program which includes automated exposure control, adjustment of the mA and/or kV according to patient size and/or use of iterative reconstruction technique. COMPARISON:  None Available. FINDINGS: CT HEAD FINDINGS Brain: No acute intracranial hemorrhage. Gray-white differentiation is preserved. No hydrocephalus or extra-axial collection. No mass effect or midline shift. Vascular: No hyperdense vessel or unexpected calcification. Skull: No calvarial fracture or suspicious bone lesion. Skull base is unremarkable. Other: None. CT MAXILLOFACIAL FINDINGS Osseous: Periapical lucency of the left mandibular second molar with medial cortical breakthrough. Extensive dental caries of the same tooth as well as the right maxillary second molar. Orbits: Negative. No traumatic or inflammatory finding. Sinuses: Well aerated. Soft tissues: Enlarged left submandibular lymph nodes with adjacent thickening of the platysma and extensive surrounding inflammatory fat stranding, consistent with odontogenic cellulitis. IMPRESSION: 1. No acute intracranial abnormality. 2. Periapical lucency of the left mandibular second molar with medial cortical breakthrough and  extensive dental caries of the same tooth as well as the right maxillary second molar. 3. Enlarged left submandibular lymph nodes with adjacent thickening of the platysma and extensive surrounding inflammatory fat stranding, consistent with odontogenic cellulitis. Electronically Signed   By: Orvan Falconer M.D.   On: 12/09/2023 21:21    PROCEDURES and INTERVENTIONS:  Procedures  Medications  amoxicillin-clavulanate (AUGMENTIN) 875-125 MG per tablet 1 tablet (1 tablet Oral Given 12/10/23 0043)     IMPRESSION / MDM / ASSESSMENT AND PLAN / ED COURSE  I reviewed the triage vital signs and the nursing notes.  Differential diagnosis includes, but is not limited to, cellulitis, abscess, periodontal infection or abscess, lymphadenitis, sialoadenitis  {Patient presents with symptoms of an acute illness or injury that is potentially life-threatening.  Patient presents with atraumatic left-sided mandibular facial swelling and redness with signs of cellulitis.  CT, as above, questions periodontal source.  Patient looks well and is suitable for outpatient management.  We will start her on antibiotics and have her follow-up with ENT this week      FINAL CLINICAL IMPRESSION(S) / ED DIAGNOSES   Final diagnoses:  Facial swelling  Facial cellulitis  Dental infection     Rx / DC Orders   ED Discharge Orders          Ordered    amoxicillin-clavulanate (AUGMENTIN) 875-125 MG tablet  2 times daily        12/10/23 0035  Note:  This document was prepared using Dragon voice recognition software and may include unintentional dictation errors.   Delton Prairie, MD 12/10/23 [phone]

## 2023-12-10 NOTE — Discharge Instructions (Signed)
Please take Tylenol and ibuprofen/Advil for your pain.  It is safe to take them together, or to alternate them every few hours.  Take up to 1000mg  of Tylenol at a time, up to 4 times per day.  Do not take more than 4000 mg of Tylenol in 24 hours.  For ibuprofen, take 400-600 mg, 3 - 4 times per day.  Augmentin antibiotics twice daily for 1 week.  Follow up with Dr. Andee Poles.

## 2023-12-11 ENCOUNTER — Ambulatory Visit
Admission: RE | Admit: 2023-12-11 | Discharge: 2023-12-11 | Disposition: A | Discharge status: Home / Self Care | Payer: Managed Care, Other (non HMO) | Source: Ambulatory Visit | Attending: Otolaryngology | Admitting: Otolaryngology

## 2023-12-11 DIAGNOSIS — R221 Localized swelling, mass and lump, neck: Secondary | ICD-10-CM

## 2023-12-13 ENCOUNTER — Inpatient Hospital Stay: Payer: Managed Care, Other (non HMO)

## 2023-12-13 ENCOUNTER — Other Ambulatory Visit: Payer: Self-pay

## 2023-12-13 ENCOUNTER — Inpatient Hospital Stay
Admission: EM | Admit: 2023-12-13 | Discharge: 2023-12-15 | DRG: 158 | Disposition: A | Discharge status: Home / Self Care | Payer: Managed Care, Other (non HMO) | Attending: Internal Medicine | Admitting: Internal Medicine

## 2023-12-13 DIAGNOSIS — N179 Acute kidney failure, unspecified: Secondary | ICD-10-CM | POA: Diagnosis present

## 2023-12-13 DIAGNOSIS — F419 Anxiety disorder, unspecified: Secondary | ICD-10-CM | POA: Diagnosis present

## 2023-12-13 DIAGNOSIS — T402X5A Adverse effect of other opioids, initial encounter: Secondary | ICD-10-CM | POA: Diagnosis not present

## 2023-12-13 DIAGNOSIS — M8620 Subacute osteomyelitis, unspecified site: Secondary | ICD-10-CM | POA: Diagnosis not present

## 2023-12-13 DIAGNOSIS — Z881 Allergy status to other antibiotic agents status: Secondary | ICD-10-CM

## 2023-12-13 DIAGNOSIS — B954 Other streptococcus as the cause of diseases classified elsewhere: Secondary | ICD-10-CM | POA: Diagnosis present

## 2023-12-13 DIAGNOSIS — T380X5A Adverse effect of glucocorticoids and synthetic analogues, initial encounter: Secondary | ICD-10-CM | POA: Diagnosis present

## 2023-12-13 DIAGNOSIS — Z882 Allergy status to sulfonamides status: Secondary | ICD-10-CM

## 2023-12-13 DIAGNOSIS — R944 Abnormal results of kidney function studies: Secondary | ICD-10-CM | POA: Diagnosis present

## 2023-12-13 DIAGNOSIS — M272 Inflammatory conditions of jaws: Secondary | ICD-10-CM | POA: Diagnosis present

## 2023-12-13 DIAGNOSIS — Z975 Presence of (intrauterine) contraceptive device: Secondary | ICD-10-CM | POA: Diagnosis not present

## 2023-12-13 DIAGNOSIS — F988 Other specified behavioral and emotional disorders with onset usually occurring in childhood and adolescence: Secondary | ICD-10-CM | POA: Diagnosis present

## 2023-12-13 DIAGNOSIS — K122 Cellulitis and abscess of mouth: Principal | ICD-10-CM | POA: Diagnosis present

## 2023-12-13 DIAGNOSIS — L03211 Cellulitis of face: Secondary | ICD-10-CM | POA: Diagnosis not present

## 2023-12-13 DIAGNOSIS — Z6841 Body Mass Index (BMI) 40.0 and over, adult: Secondary | ICD-10-CM

## 2023-12-13 DIAGNOSIS — Z793 Long term (current) use of hormonal contraceptives: Secondary | ICD-10-CM | POA: Diagnosis not present

## 2023-12-13 DIAGNOSIS — E66813 Obesity, class 3: Secondary | ICD-10-CM | POA: Diagnosis present

## 2023-12-13 DIAGNOSIS — Z9109 Other allergy status, other than to drugs and biological substances: Secondary | ICD-10-CM | POA: Diagnosis not present

## 2023-12-13 DIAGNOSIS — E785 Hyperlipidemia, unspecified: Secondary | ICD-10-CM | POA: Diagnosis present

## 2023-12-13 DIAGNOSIS — Z79899 Other long term (current) drug therapy: Secondary | ICD-10-CM | POA: Diagnosis not present

## 2023-12-13 DIAGNOSIS — I952 Hypotension due to drugs: Secondary | ICD-10-CM | POA: Diagnosis not present

## 2023-12-13 DIAGNOSIS — F32A Depression, unspecified: Secondary | ICD-10-CM | POA: Diagnosis present

## 2023-12-13 DIAGNOSIS — I959 Hypotension, unspecified: Secondary | ICD-10-CM

## 2023-12-13 DIAGNOSIS — Z91040 Latex allergy status: Secondary | ICD-10-CM

## 2023-12-13 DIAGNOSIS — E559 Vitamin D deficiency, unspecified: Secondary | ICD-10-CM | POA: Diagnosis present

## 2023-12-13 DIAGNOSIS — L0211 Cutaneous abscess of neck: Principal | ICD-10-CM

## 2023-12-13 DIAGNOSIS — R7989 Other specified abnormal findings of blood chemistry: Secondary | ICD-10-CM | POA: Diagnosis not present

## 2023-12-13 LAB — CBC WITH DIFFERENTIAL/PLATELET
Abs Immature Granulocytes: 0.04 10*3/uL (ref 0.00–0.07)
Basophils Absolute: 0.1 10*3/uL (ref 0.0–0.1)
Basophils Relative: 0 %
Eosinophils Absolute: 0.1 10*3/uL (ref 0.0–0.5)
Eosinophils Relative: 1 %
HCT: 38.5 % (ref 36.0–46.0)
Hemoglobin: 12.2 g/dL (ref 12.0–15.0)
Immature Granulocytes: 0 %
Lymphocytes Relative: 14 %
Lymphs Abs: 1.8 10*3/uL (ref 0.7–4.0)
MCH: 28.2 pg (ref 26.0–34.0)
MCHC: 31.7 g/dL (ref 30.0–36.0)
MCV: 88.9 fL (ref 80.0–100.0)
Monocytes Absolute: 0.7 10*3/uL (ref 0.1–1.0)
Monocytes Relative: 5 %
Neutro Abs: 10 10*3/uL — ABNORMAL HIGH (ref 1.7–7.7)
Neutrophils Relative %: 80 %
Platelets: 399 10*3/uL (ref 150–400)
RBC: 4.33 MIL/uL (ref 3.87–5.11)
RDW: 12.4 % (ref 11.5–15.5)
WBC: 12.7 10*3/uL — ABNORMAL HIGH (ref 4.0–10.5)
nRBC: 0 % (ref 0.0–0.2)

## 2023-12-13 LAB — BASIC METABOLIC PANEL
Anion gap: 12 (ref 5–15)
BUN: 12 mg/dL (ref 6–20)
CO2: 21 mmol/L — ABNORMAL LOW (ref 22–32)
Calcium: 8.8 mg/dL — ABNORMAL LOW (ref 8.9–10.3)
Chloride: 104 mmol/L (ref 98–111)
Creatinine, Ser: 1.13 mg/dL — ABNORMAL HIGH (ref 0.44–1.00)
GFR, Estimated: >60 mL/min (ref >60)
Glucose, Bld: 91 mg/dL (ref 70–99)
Potassium: 3.8 mmol/L (ref 3.5–5.1)
Sodium: 137 mmol/L (ref 135–145)

## 2023-12-13 MED ORDER — ONDANSETRON HCL 4 MG/2ML IJ SOLN
4.0000 mg | Freq: Four times a day (QID) | INTRAMUSCULAR | Status: DC | PRN
  Administered 2023-12-13 – 2023-12-14 (×2): 4 mg via INTRAVENOUS
  Filled 2023-12-13 (×2): qty 2

## 2023-12-13 MED ORDER — ACETAMINOPHEN 650 MG RE SUPP: 650.0000 mg | Freq: Four times a day (QID) | RECTAL | Status: DC | PRN

## 2023-12-13 MED ORDER — SODIUM CHLORIDE 0.9% FLUSH
3.0000 mL | Freq: Two times a day (BID) | INTRAVENOUS | Status: DC
  Administered 2023-12-13 – 2023-12-15 (×5): 3 mL via INTRAVENOUS

## 2023-12-13 MED ORDER — VANCOMYCIN HCL 1250 MG/250ML IV SOLN: 1250.0000 mg | INTRAVENOUS | Status: DC

## 2023-12-13 MED ORDER — HYDROMORPHONE HCL 1 MG/ML IJ SOLN
0.5000 mg | INTRAMUSCULAR | Status: DC | PRN
  Administered 2023-12-13 (×2): 0.5 mg via INTRAVENOUS
  Administered 2023-12-14 – 2023-12-15 (×2): 1 mg via INTRAVENOUS
  Filled 2023-12-13 (×4): qty 1

## 2023-12-13 MED ORDER — SODIUM CHLORIDE 0.9 % IV BOLUS
1000.0000 mL | Freq: Once | INTRAVENOUS | Status: AC
  Administered 2023-12-13: 1000 mL via INTRAVENOUS

## 2023-12-13 MED ORDER — ESCITALOPRAM OXALATE 10 MG PO TABS
10.0000 mg | ORAL_TABLET | Freq: Every day | ORAL | Status: DC
  Administered 2023-12-13 – 2023-12-14 (×2): 10 mg via ORAL
  Filled 2023-12-13 (×2): qty 1

## 2023-12-13 MED ORDER — ONDANSETRON HCL 4 MG PO TABS: 4.0000 mg | ORAL_TABLET | Freq: Four times a day (QID) | ORAL | Status: DC | PRN

## 2023-12-13 MED ORDER — MORPHINE SULFATE (PF) 4 MG/ML IV SOLN
4.0000 mg | Freq: Once | INTRAVENOUS | Status: AC
Start: 2023-12-13 — End: 2023-12-13
  Administered 2023-12-13: 4 mg via INTRAVENOUS
  Filled 2023-12-13: qty 1

## 2023-12-13 MED ORDER — LIDOCAINE HCL (PF) 1 % IJ SOLN
10.0000 mL | Freq: Once | INTRAMUSCULAR | Status: AC
  Administered 2023-12-13: 10 mL via INTRADERMAL

## 2023-12-13 MED ORDER — BUPROPION HCL ER (XL) 150 MG PO TB24
300.0000 mg | ORAL_TABLET | Freq: Every day | ORAL | Status: DC
  Administered 2023-12-13 – 2023-12-15 (×3): 300 mg via ORAL
  Filled 2023-12-13 (×3): qty 2

## 2023-12-13 MED ORDER — ACETAMINOPHEN 325 MG PO TABS: 650.0000 mg | ORAL_TABLET | Freq: Four times a day (QID) | ORAL | Status: DC | PRN

## 2023-12-13 MED ORDER — VITAMIN D 25 MCG (1000 UNIT) PO TABS
2000.0000 [IU] | ORAL_TABLET | Freq: Every day | ORAL | Status: DC
  Administered 2023-12-13 – 2023-12-15 (×3): 2000 [IU] via ORAL
  Filled 2023-12-13 (×3): qty 2

## 2023-12-13 MED ORDER — VANCOMYCIN HCL IN DEXTROSE 1-5 GM/200ML-% IV SOLN
1000.0000 mg | Freq: Once | INTRAVENOUS | Status: AC
  Administered 2023-12-13: 1000 mg via INTRAVENOUS
  Filled 2023-12-13: qty 200

## 2023-12-13 MED ORDER — PIPERACILLIN-TAZOBACTAM 3.375 G IVPB
3.3750 g | Freq: Once | INTRAVENOUS | Status: AC
  Administered 2023-12-13: 3.375 g via INTRAVENOUS
  Filled 2023-12-13: qty 50

## 2023-12-13 MED ORDER — DEXAMETHASONE SODIUM PHOSPHATE 10 MG/ML IJ SOLN
10.0000 mg | Freq: Once | INTRAMUSCULAR | Status: AC
  Administered 2023-12-13: 10 mg via INTRAVENOUS
  Filled 2023-12-13: qty 1

## 2023-12-13 MED ORDER — PIPERACILLIN-TAZOBACTAM 3.375 G IVPB
3.3750 g | Freq: Three times a day (TID) | INTRAVENOUS | Status: AC
  Administered 2023-12-13 – 2023-12-14 (×4): 3.375 g via INTRAVENOUS
  Filled 2023-12-13 (×4): qty 50

## 2023-12-13 MED ORDER — ONDANSETRON HCL 4 MG/2ML IJ SOLN
4.0000 mg | Freq: Once | INTRAMUSCULAR | Status: AC
  Administered 2023-12-13: 4 mg via INTRAVENOUS
  Filled 2023-12-13: qty 2

## 2023-12-13 NOTE — Assessment & Plan Note (Signed)
Chronic elevation of creatinine with no diagnosis of CKD.  Currently at baseline.  Likely due to obesity.  - Continue to monitor renal indices while admitted

## 2023-12-13 NOTE — Consult Note (Addendum)
Pharmacy Antibiotic Note  Tonya Hill is a 32 y.o. female admitted on 12/13/2023 with an abscess as a result of a dental infection. From chart review, patient was sent home from the ED on 12/16 with a 7 day Augmentin script. Pharmacy has been consulted for vancomycin dosing.  Today, 12/13/2023 Day 1 of antibiotics  WBC 12.7 Afebrile  Scr 1.13 today with estimated CrCl 77.2 mL/min  Patient received Zosyn 3.375 gm IV x 1 in the ED  Plan: Give a loading dose of vancomycin 2000 mg IV x 1  Start maintenance regimen of vancomycin 1250 mg IV Q24H Goal AUC 400 - 550 Estimated AUC 524.1 Estimated Cmin 12.5 Scr 1.13, IBW, Vd 0.5 Start Zosyn 3.375 gm IV Q8H based on current renal function  Pharmacy will continue to monitor and dose adjust appropriately   Height: 5' (152.4 cm) Weight: 99.8 kg (220 lb 0.3 oz) IBW/kg (Calculated) : 45.5  Temp (24hrs), Avg:98.3 F (36.8 C), Min:98.3 F (36.8 C), Max:98.3 F (36.8 C)  Recent Labs  Lab 12/13/23 1145  WBC 12.7*  CREATININE 1.13*    Estimated Creatinine Clearance: 77.2 mL/min (A) (by C-G formula based on SCr of 1.13 mg/dL (H)).    Allergies  Allergen Reactions   Sulfa Antibiotics Itching and Shortness Of Breath   Doxycycline Other (See Comments)    hallucinations   Latex Rash   Omnicef [Cefdinir] Rash   Tape Rash   Antimicrobials this admission: 12/19 Zosyn >> 12/19 Vancomycin >>   Dose adjustments this admission:  Microbiology results: 12/19 BCx: sent  Thank you for allowing pharmacy to be a part of this patient's care.  Littie Deeds, PharmD Pharmacy Resident  12/13/2023 2:01 PM

## 2023-12-13 NOTE — H&P (Addendum)
History and Physical    Patient: Tonya Hill XLK:[id number] DOB: 08-09-1991 DOA: 12/13/2023 DOS: the patient was seen and examined on 12/13/2023 PCP: Jerl Mina, MD  Patient coming from: Home  Chief Complaint:  Chief Complaint  Patient presents with   Abscess   HPI: Tonya Hill is a 32 y.o. female with medical history significant of endometriosis, anxiety/depression, hyperlipidemia, who presents to the ED due to an abscess.  Mrs. Thornberg states that she began to have a little area of induration on the left side of her submandibular region with very small area of overlying redness that was barely noticeable.  Then over the last several days, she has noticed progressive worsening with swelling radiating down her neck.  She has continued to be able to drink but has struggled with chewing and so has had little to eat.  She denies any difficulty breathing, swallowing, nausea, vomiting, chest pain or palpitations.  She endorses a cough that is nonproductive.  Since starting Augmentin, she has not noticed any improvement and states swelling seems to have worsened.  Per chart review, patient presented to the ED on 12/16 with similar symptoms at which time CT imaging demonstrated otogenic cellulitis.  She was started on Augmentin and discharged home.  Today, patient had a visit with ENT at which time a an ultrasound of the neck was ordered.  Ultrasound completed yesterday with results demonstrating a 5.3 x 2.4 x 2.0 cm abscess in the submandibular region.  ENT recommended patient come to the ED for drainage and IV antibiotics.  ED course: On arrival to the ED, patient was normotensive at 135/84 with heart rate of 84.  She was saturating at 100% on room air.  She was afebrile at 98.3.  Initial workup notable for WBC of 12.7, bicarb 21, creat 1.13 with GFR above 60.  Patient started on IV fluids and Zosyn.  TRH contacted for admission.  Review of Systems: As mentioned in the history of present  illness. All other systems reviewed and are negative.  Past Medical History:  Diagnosis Date   Anemia    Anxiety    Depression    Endometriosis    Ovarian cyst    Vitamin D deficiency 2020   Past Surgical History:  Procedure Laterality Date   INTRAUTERINE DEVICE INSERTION  12/26/2015   laproscopic abdominal surgery     to remove endometriosis tissue   Social History:  reports that she has never smoked. She has never used smokeless tobacco. She reports current alcohol use of about 3.0 standard drinks of alcohol per week. She reports that she does not use drugs.  Allergies  Allergen Reactions   Sulfa Antibiotics Itching and Shortness Of Breath   Doxycycline Other (See Comments)    hallucinations   Latex Rash   Omnicef [Cefdinir] Rash   Tape Rash    Family History  Problem Relation Age of Onset   Hypertension Sister    Diabetes Paternal Grandfather    Hypertension Paternal Grandfather    Hypertension Father     Prior to Admission medications   Medication Sig Start Date End Date Taking? Authorizing Provider  amoxicillin-clavulanate (AUGMENTIN) 875-125 MG tablet Take 1 tablet by mouth 2 (two) times daily for 7 days. 12/10/23 12/17/23 Yes Delton Prairie, MD  amphetamine-dextroamphetamine (ADDERALL XR) 20 MG 24 hr capsule Take by mouth. 10/09/21 12/13/23 Yes [provider]  amphetamine-dextroamphetamine (ADDERALL) 10 MG tablet Take 10 mg by mouth daily.   Yes [provider]  buPROPion (  WELLBUTRIN XL) 150 MG 24 hr tablet Take 300 mg by mouth.  01/15/20  Yes [provider]  Cholecalciferol (VITAMIN D) 50 MCG (2000 UT) CAPS Take 2,000 Units by mouth daily. 01/25/23  Yes [provider]  escitalopram (LEXAPRO) 10 MG tablet Take 1 tablet by mouth daily. 11/21/23  Yes [provider]  hyoscyamine (LEVSIN SL) 0.125 MG SL tablet Place 1 tablet (0.125 mg total) under the tongue every 4 (four) hours as needed for cramping (abdominal pain).  04/04/23  Yes Pollina, Canary Brim, MD  pantoprazole (PROTONIX) 40 MG tablet Take 1 tablet (40 mg total) by mouth daily. 04/04/23  Yes Gilda Crease, MD  JUNEL FE 1/20 1-20 MG-MCG tablet TAKE 1 TABLET BY MOUTH EVERY DAY 10/29/23   Wyline Beady A, NP  sertraline (ZOLOFT) 25 MG tablet Take 25 mg by mouth daily. Patient not taking: Reported on 12/13/2023 10/13/21   [provider]  sucralfate (CARAFATE) 1 GM/10ML suspension Take 10 mLs (1 g total) by mouth 4 (four) times daily -  with meals and at bedtime. Patient not taking: Reported on 12/13/2023 04/04/23   Gilda Crease, MD  levonorgestrel (MIRENA) 20 MCG/24HR IUD by Intrauterine route.  08/02/20  [provider]  phentermine (ADIPEX-P) 37.5 MG tablet Take by mouth. 06/02/19 12/28/19  [provider]    Physical Exam: Vitals:   12/13/23 1059 12/13/23 1100 12/13/23 1200 12/13/23 1230  BP: 135/84  (!) 89/55 (!) 93/50  Pulse: 84  94 81  Resp: 16     Temp:  98.3 F (36.8 C)    TempSrc:  Oral    SpO2: 100%  100% 100%  Weight: 99.8 kg     Height: 5' (1.524 m)      Physical Exam Vitals and nursing note reviewed.  Constitutional:      General: She is not in acute distress.    Appearance: She is obese.  HENT:     Head: Normocephalic and atraumatic.  Eyes:     Conjunctiva/sclera: Conjunctivae normal.     Pupils: Pupils are equal, round, and reactive to light.  Neck:     Comments: Significant swelling, induration and erythema involving the entire left submandibular region with radiation down into the neck.  Increased warmth to touch.  No radiation to the right side, stops at midline. Cardiovascular:     Rate and Rhythm: Normal rate and regular rhythm.     Heart sounds: No murmur heard.    No gallop.  Pulmonary:     Effort: Pulmonary effort is normal. No respiratory distress.     Breath sounds: Normal breath sounds. No wheezing, rhonchi or rales.  Skin:    General: Skin is warm and dry.   Neurological:     Mental Status: She is alert and oriented to person, place, and time. Mental status is at baseline.  Psychiatric:        Mood and Affect: Mood normal.        Behavior: Behavior normal.    Data Reviewed: CBC with WBC 12.7, hemoglobin 12.2, and platelets of 399 BMP with serum of 137, potassium 3.8, bicarb 21, BUN 12, creat 1.13 with GFR above 60  CT maxillofacial on 12/15: IMPRESSION: 1. No acute intracranial abnormality. 2. Periapical lucency of the left mandibular second molar with medial cortical breakthrough and extensive dental caries of the same tooth as well as the right maxillary second molar. 3. Enlarged left submandibular lymph nodes with adjacent thickening of the platysma and  extensive surrounding inflammatory fat stranding, consistent with odontogenic cellulitis.  Soft tissue ultrasound obtained on 12/17: IMPRESSION: Sonographic evaluation of patient's area of discomfort involving the submandibular region of the left side of the neck correlates with a ill-defined mixed echogenic apparent fluid collection/abscess measuring approximately 5.3 x 2.4 x 2.0 cm, potentially progressed compared to noncontrast maxillofacial CT performed 12/09/2023.  There are no new results to review at this time.  Assessment and Plan:  * Submandibular abscess Patient presenting with worsening left-sided facial swelling and induration with evidence of a large abscess seen on ultrasound 2 days prior.  ENT recommending IR guided drainage.  - ENT consulted; appreciate their recommendations - IR guided fine-needle aspiration ordered - Will obtain cultures from abscess fluid - Continue Zosyn per pharmacy dosing - Given progression and high risk, start vancomycin per pharmacy dosing and obtain MRSA PCR - S/p 1 L bolus  Hypotension Initially normal blood pressure, that decreased to as low as 89 systolic after receiving morphine.  Likely affected by poor p.o. intake over the  last several days.  Patient states that she occasionally has low blood pressure at home 2.  - S/p 1 L bolus - If persistent, will continue maintenance fluids while patient is n.p.o.  Elevated serum creatinine Chronic elevation of creatinine with no diagnosis of CKD.  Currently at baseline.  Likely due to obesity.  - Continue to monitor renal indices while admitted  ADD (attention deficit disorder) - Hold home Adderall while admitted  Anxiety and depression - Continue home regimen  Advance Care Planning:   Code Status: Full Code   Consults: ENT  Family Communication: Patient's mother updated at bedside  Severity of Illness: The appropriate patient status for this patient is INPATIENT. Inpatient status is judged to be reasonable and necessary in order to provide the required intensity of service to ensure the patient's safety. The patient's presenting symptoms, physical exam findings, and initial radiographic and laboratory data in the context of their chronic comorbidities is felt to place them at high risk for further clinical deterioration. Furthermore, it is not anticipated that the patient will be medically stable for discharge from the hospital within 2 midnights of admission.   * I certify that at the point of admission it is my clinical judgment that the patient will require inpatient hospital care spanning beyond 2 midnights from the point of admission due to high intensity of service, high risk for further deterioration and high frequency of surveillance required.*  Author: Verdene Lennert, MD 12/13/2023 1:59 PM  For on call review [email or web address].

## 2023-12-13 NOTE — Assessment & Plan Note (Signed)
-   Continue home regimen 

## 2023-12-13 NOTE — ED Triage Notes (Signed)
Pt here with an abscess on the left side of her face and needing IV abx. Pt alert and oriented.

## 2023-12-13 NOTE — Assessment & Plan Note (Signed)
Initially normal blood pressure, that decreased to as low as 89 systolic after receiving morphine.  Likely affected by poor p.o. intake over the last several days.  Patient states that she occasionally has low blood pressure at home 2.  - S/p 1 L bolus - If persistent, will continue maintenance fluids while patient is n.p.o.

## 2023-12-13 NOTE — Assessment & Plan Note (Addendum)
Patient presenting with worsening left-sided facial swelling and induration with evidence of a large abscess seen on ultrasound 2 days prior.  ENT recommending IR guided drainage.  - ENT consulted; appreciate their recommendations - IR guided fine-needle aspiration ordered - Will obtain cultures from abscess fluid - Continue Zosyn per pharmacy dosing - Given progression and high risk, start vancomycin per pharmacy dosing and obtain MRSA PCR - S/p 1 L bolus

## 2023-12-13 NOTE — Consult Note (Signed)
..Tonya Hill, Tonya Hill [id number] [birth year] Shaune Pollack, MD  Reason for Consult: odontogenic abscess  HPI: 32 year old female with history of left sided neck swelling that has significantly worsened over the last week.  Initially seen as outpatient last week and 6 month history of neck mass with acute worsening noticed.  Suspected odontogenic given location and mandibular molar.  Placed on Clindamycin and ordered US.  Worsened significantly and had issues opening mouth and swallowing and presented to ER 12/14.  Had a syncopal episode while in ER and underwent non-contrasted CT that showed cellulitis and fat stranding.  She was placed on Augmentin.  [place] on 12/17 showed 5 x 2cm abscess.  Presented to my office with significantly increased induration and pain.  No flucutance palpable.  Recommended admisstion for IV abx and hopefully IR drainage.  Allergies:  Allergies  Allergen Reactions   Sulfa Antibiotics Itching and Shortness Of Breath   Doxycycline Other (See Comments)    hallucinations   Latex Rash   Omnicef [Cefdinir] Rash   Tape Rash    ROS: Review of systems normal other than 12 systems except per HPI.  PMH:  Past Medical History:  Diagnosis Date   Anemia    Anxiety    Depression    Endometriosis    Ovarian cyst    Vitamin D deficiency 2020    FH:  Family History  Problem Relation Age of Onset   Hypertension Sister    Diabetes Paternal Grandfather    Hypertension Paternal Grandfather    Hypertension Father     SH:  Social History   Socioeconomic History   Marital status: Married    Spouse name: Not on file   Number of children: Not on file   Years of education: Not on file   Highest education level: Not on file  Occupational History   Not on file  Tobacco Use   Smoking status: Never   Smokeless tobacco: Never  Vaping Use   Vaping status: Never Used  Substance and Sexual Activity   Alcohol use: Yes    Alcohol/week: 3.0 standard drinks of alcohol    Types: 3  Standard drinks or equivalent per week   Drug use: No   Sexual activity: Yes    Partners: Male    Birth control/protection: None  Other Topics Concern   Not on file  Social History Narrative   ** Merged History Encounter **       2/17: she graduated from college in 1/17 with a degree in psychology. She is looking for work and plans to go back to school and get her masters in psychology   Social Drivers of Health   Financial Resource Strain: Not on file  Food Insecurity: No Food Insecurity (01/06/2021)   Received from [facility], [facility]   Hunger Vital Sign    Worried About Running Out of Food in the Last Year: Never true    Ran Out of Food in the Last Year: Never true  Transportation Needs: Not on file  Physical Activity: Insufficiently Active (09/21/2020)   Received from [facility], [facility]   Exercise Vital Sign    Days of Exercise per Week: 2 days    Minutes of Exercise per Session: 30 min  Stress: No Stress Concern Present (09/21/2020)   Received from [facility], [facility] - Occupational Stress Questionnaire    Feeling of Stress : Not at all  Social Connections: Unknown (04/27/2022)  Received from [facility], [facility]   Social Network    Social Network: Not on file  Intimate Partner Violence: Unknown (03/31/2022)   Received from [facility], [facility]   HITS    Physically Hurt: Not on file    Insult or Talk Down To: Not on file    Threaten Physical Harm: Not on file    Scream or Curse: Not on file    PSH:  Past Surgical History:  Procedure Laterality Date   INTRAUTERINE DEVICE INSERTION  12/26/2015   laproscopic abdominal surgery     to remove endometriosis tissue    Physical  Exam:  GEN-  NAD, supine NEURO-CN 2-12 grossly intact and symmetric. EARS- EAC/TMs normal BL.  OC/OP-  trismus due to pain.  Fractured left mandibular molar. Floor of mouth supple with no edema.  Posterior  airway widely patent NECK-  significant induration and tenderness and swelling of left neck extending from body of mandible across midline in submental region and inferiorly down to level 3 in neck.  No obvious flucutance felt RESP- unlabored CARD-  RRR  A/P: Odontogenic abscess failing outpatient therapy.  Agree with IV abx/steroids.  IR drainage ordered.  Will follow.  Fortunately no involvement of airway or signs of ludwigs at this time.   Tonya Hill 12/13/2023 1:14 PM

## 2023-12-13 NOTE — Assessment & Plan Note (Signed)
-   Hold home Adderall while admitted

## 2023-12-13 NOTE — ED Provider Notes (Signed)
[facility] Provider Note    Event Date/Time   First MD Initiated Contact with Patient 12/13/23 1102     (approximate)   History   Abscess   HPI  Tonya Hill is a 32 y.o. female here with facial abscess.  The patient states that she has had progressively worsening left lower dental and upper jaw pain for the last week to a week and a half.  She was initially seen by her PCP at the onset of symptoms, as well as in the ED 2 days ago.  At that time, she had a Noncon CT scan, which showed periapical infection but this did not appear to extend down into the neck.  Since then, she has had worsening swelling, induration, and pain.  She states she was having some difficulty swallowing even at that time.  She was placed on Augmentin and sent out.  ENT ordered an ultrasound yesterday which showed a large abscess.  She was seen in clinic today and sent here for IR guided drainage as well as IV antibiotics.  Patient states her difficulty swallowing has improved since being on Augmentin, but otherwise the area of swelling and pain has increased.     Physical Exam   Triage Vital Signs: ED Triage Vitals  Encounter Vitals Group     BP 12/13/23 1059 135/84     Systolic BP Percentile --      Diastolic BP Percentile --      Pulse Rate 12/13/23 1059 84     Resp 12/13/23 1059 16     Temp 12/13/23 1100 98.3 F (36.8 C)     Temp Source 12/13/23 1100 Oral     SpO2 12/13/23 1059 100 %     Weight 12/13/23 1059 220 lb 0.3 oz (99.8 kg)     Height 12/13/23 1059 5' (1.524 m)     Head Circumference --      Peak Flow --      Pain Score 12/13/23 1059 7     Pain Loc --      Pain Education --      Exclude from Growth Chart --     Most recent vital signs: Vitals:   12/13/23 1800 12/13/23 1846  BP: 121/69   Pulse: 80   Resp: 16   Temp:  98.3 F (36.8 C)  SpO2: 100%      General: Awake, no distress.  CV:  Good peripheral perfusion.  Resp:  Normal work of breathing.   Abd:  No distention.  Other:  Large, firm, indurated area with areas of fluctuance along the left submandibular space extending across the midline and inferiorly down the upper half of the left neck.  There is marked tenderness as well as erythema overlying the area.  No sublingual edema.  Posterior oropharynx appears grossly normal although she does have some mild trismus.   ED Results / Procedures / Treatments   Labs (all labs ordered are listed, but only abnormal results are displayed) Labs Reviewed  CBC WITH DIFFERENTIAL/PLATELET - Abnormal; Notable for the following components:      Result Value   WBC 12.7 (*)    Neutro Abs 10.0 (*)    All other components within normal limits  BASIC METABOLIC PANEL - Abnormal; Notable for the following components:   CO2 21 (*)    Creatinine, Ser 1.13 (*)    Calcium 8.8 (*)    All other components within normal limits  CULTURE, BLOOD (ROUTINE  X 2)  CULTURE, BLOOD (ROUTINE X 2)  MRSA NEXT GEN BY PCR, NASAL  AEROBIC/ANAEROBIC CULTURE W GRAM STAIN (SURGICAL/DEEP WOUND)  HIV ANTIBODY (ROUTINE TESTING W REFLEX)  CBC WITH DIFFERENTIAL/PLATELET  BASIC METABOLIC PANEL     EKG    RADIOLOGY Ultrasound soft tissue 12/17: Submandibular fluid collection/abscess measuring 4.3 x 2.4 x 2.0 cm   I also independently reviewed and agree with radiologist interpretations.   PROCEDURES:  Critical Care performed: No   MEDICATIONS ORDERED IN ED: Medications  sodium chloride flush (NS) 0.9 % injection 3 mL (3 mLs Intravenous Given 12/13/23 2110)  acetaminophen (TYLENOL) tablet 650 mg (has no administration in time range)    Or  acetaminophen (TYLENOL) suppository 650 mg (has no administration in time range)  HYDROmorphone (DILAUDID) injection 0.5-1 mg (0.5 mg Intravenous Given 12/13/23 2019)  ondansetron (ZOFRAN) tablet 4 mg ( Oral See Alternative 12/13/23 1608)    Or  ondansetron (ZOFRAN) injection 4 mg (4 mg Intravenous Given 12/13/23 1608)   piperacillin-tazobactam (ZOSYN) IVPB 3.375 g (3.375 g Intravenous New Bag/Given 12/13/23 2034)  vancomycin (VANCOREADY) IVPB 1250 mg/250 mL (has no administration in time range)  buPROPion (WELLBUTRIN XL) 24 hr tablet 300 mg (300 mg Oral Given 12/13/23 1933)  cholecalciferol (VITAMIN D3) 25 MCG (1000 UNIT) tablet 2,000 Units (2,000 Units Oral Given 12/13/23 1757)  escitalopram (LEXAPRO) tablet 10 mg (10 mg Oral Not Given 12/13/23 2109)  dexamethasone (DECADRON) injection 10 mg (10 mg Intravenous Given 12/13/23 1147)  piperacillin-tazobactam (ZOSYN) IVPB 3.375 g (0 g Intravenous Stopped 12/13/23 1559)  morphine (PF) 4 MG/ML injection 4 mg (4 mg Intravenous Given 12/13/23 1229)  ondansetron (ZOFRAN) injection 4 mg (4 mg Intravenous Given 12/13/23 1228)  sodium chloride 0.9 % bolus 1,000 mL (1,000 mLs Intravenous New Bag/Given 12/13/23 1346)  vancomycin (VANCOCIN) IVPB 1000 mg/200 mL premix (0 mg Intravenous Stopped 12/13/23 1719)    Followed by  vancomycin (VANCOCIN) IVPB 1000 mg/200 mL premix (0 mg Intravenous Stopped 12/13/23 1938)  lidocaine (PF) (XYLOCAINE) 1 % injection 10 mL (10 mLs Intradermal Given by Other 12/13/23 1553)     IMPRESSION / MDM / ASSESSMENT AND PLAN / ED COURSE  I reviewed the triage vital signs and the nursing notes.                              Differential diagnosis includes, but is not limited to, submandibular abscess, dental infection w/ abscess, unlikely Ludwig's, sepsis  Patient's presentation is most consistent with acute presentation with potential threat to life or bodily function.  The patient is on the cardiac monitor to evaluate for evidence of arrhythmia and/or significant heart rate changes  32 yo F here with dental/submandibular abscess. Discussed case with Dr. Andee Poles. Pt will be started on IV ABX, decadron. IR consulted for FNA.    FINAL CLINICAL IMPRESSION(S) / ED DIAGNOSES   Final diagnoses:  Submandibular abscess     Rx / DC Orders    ED Discharge Orders     None        Note:  This document was prepared using Dragon voice recognition software and may include unintentional dictation errors.   Shaune Pollack, MD 12/13/23 2221

## 2023-12-13 NOTE — Consult Note (Signed)
ED Pharmacy Antibiotic Sign Off An antibiotic consult was received from an ED provider for Zosyn per pharmacy dosing for dental abscess. A chart review was completed to assess appropriateness.   The following one time order(s) were placed: Zosyn 3.375 gm   Further antibiotic and/or antibiotic pharmacy consults should be ordered by the admitting provider if indicated.   Thank you for allowing pharmacy to be a part of this patient's care.   Littie Deeds, PharmD Pharmacy Resident  12/13/2023 1:35 PM

## 2023-12-13 NOTE — Procedures (Signed)
Interventional Radiology Procedure Note  Procedure:   US guided aspiration of left submental/submandibular abscess.  ~10cc of purulent material aspirated  Complications: None  Recommendations:  - Ok to  advance diet per primary order - follow up culture - Do not submerge - Routine wound care   Signed,  Yvone Neu. Loreta Ave, DO

## 2023-12-13 NOTE — Consult Note (Deleted)
Pharmacy Antibiotic Note  Tonya Hill is a 32 y.o. female that presented with an abscess on the left side of her face as a result of a dental infection. From chart review, patient has recently filled a 7 day script of Augmentin on 12/10/2023. Pharmacy has been consulted for Zosyn dosing.  Today, 12/13/2023: Day 1 of Zosyn  WBC 12.7  Afebrile  Scr 1.13 today with estimated CrCl 77.2 mL/min   Plan: Start Zosyn 3.375 gm IV Q8H based on current renal function  Pharmacy will continue to monitor and dose adjust appropriately   No data recorded.  Recent Labs  Lab 12/13/23 1145  WBC 12.7*  CREATININE 1.13*    CrCl cannot be calculated (Patient's most recent lab result is older than the maximum 21 days allowed.).    Allergies  Allergen Reactions   Sulfa Antibiotics Itching and Shortness Of Breath   Doxycycline Other (See Comments)    hallucinations   Latex Rash   Omnicef [Cefdinir] Rash   Tape Rash   Antimicrobials this admission: 12/19 Zosyn >>   Dose adjustments this admission:  Microbiology results: 12/19 BCx: sent   Thank you for allowing pharmacy to be a part of this patient's care.  Littie Deeds, PharmD Pharmacy Resident  12/13/2023 10:57 AM

## 2023-12-14 ENCOUNTER — Encounter: Payer: Self-pay | Admitting: Internal Medicine

## 2023-12-14 DIAGNOSIS — M272 Inflammatory conditions of jaws: Secondary | ICD-10-CM

## 2023-12-14 DIAGNOSIS — L03211 Cellulitis of face: Secondary | ICD-10-CM | POA: Diagnosis not present

## 2023-12-14 DIAGNOSIS — M8620 Subacute osteomyelitis, unspecified site: Secondary | ICD-10-CM

## 2023-12-14 DIAGNOSIS — K122 Cellulitis and abscess of mouth: Secondary | ICD-10-CM | POA: Diagnosis not present

## 2023-12-14 LAB — CBC WITH DIFFERENTIAL/PLATELET
Abs Immature Granulocytes: 0.07 10*3/uL (ref 0.00–0.07)
Basophils Absolute: 0 10*3/uL (ref 0.0–0.1)
Basophils Relative: 0 %
Eosinophils Absolute: 0 10*3/uL (ref 0.0–0.5)
Eosinophils Relative: 0 %
HCT: 36 % (ref 36.0–46.0)
Hemoglobin: 11.7 g/dL — ABNORMAL LOW (ref 12.0–15.0)
Immature Granulocytes: 0 %
Lymphocytes Relative: 7 %
Lymphs Abs: 1 10*3/uL (ref 0.7–4.0)
MCH: 28.6 pg (ref 26.0–34.0)
MCHC: 32.5 g/dL (ref 30.0–36.0)
MCV: 88 fL (ref 80.0–100.0)
Monocytes Absolute: 0.4 10*3/uL (ref 0.1–1.0)
Monocytes Relative: 3 %
Neutro Abs: 14.3 10*3/uL — ABNORMAL HIGH (ref 1.7–7.7)
Neutrophils Relative %: 90 %
Platelets: 411 10*3/uL — ABNORMAL HIGH (ref 150–400)
RBC: 4.09 MIL/uL (ref 3.87–5.11)
RDW: 12.4 % (ref 11.5–15.5)
WBC: 15.9 10*3/uL — ABNORMAL HIGH (ref 4.0–10.5)
nRBC: 0 % (ref 0.0–0.2)

## 2023-12-14 LAB — HEPATIC FUNCTION PANEL
ALT: 15 U/L (ref 0–44)
AST: 20 U/L (ref 15–41)
Albumin: 3.3 g/dL — ABNORMAL LOW (ref 3.5–5.0)
Alkaline Phosphatase: 76 U/L (ref 38–126)
Bilirubin, Direct: 0.3 mg/dL — ABNORMAL HIGH (ref 0.0–0.2)
Indirect Bilirubin: 0.6 mg/dL (ref 0.3–0.9)
Total Bilirubin: 0.9 mg/dL (ref <1.2)
Total Protein: 7.3 g/dL (ref 6.5–8.1)

## 2023-12-14 LAB — MRSA NEXT GEN BY PCR, NASAL: MRSA by PCR Next Gen: NOT DETECTED

## 2023-12-14 LAB — BASIC METABOLIC PANEL
Anion gap: 10 (ref 5–15)
BUN: 13 mg/dL (ref 6–20)
CO2: 20 mmol/L — ABNORMAL LOW (ref 22–32)
Calcium: 8.7 mg/dL — ABNORMAL LOW (ref 8.9–10.3)
Chloride: 105 mmol/L (ref 98–111)
Creatinine, Ser: 0.93 mg/dL (ref 0.44–1.00)
GFR, Estimated: >60 mL/min (ref >60)
Glucose, Bld: 130 mg/dL — ABNORMAL HIGH (ref 70–99)
Potassium: 4.5 mmol/L (ref 3.5–5.1)
Sodium: 135 mmol/L (ref 135–145)

## 2023-12-14 MED ORDER — DEXAMETHASONE SODIUM PHOSPHATE 10 MG/ML IJ SOLN
10.0000 mg | Freq: Once | INTRAMUSCULAR | Status: AC
  Administered 2023-12-14: 10 mg via INTRAVENOUS
  Filled 2023-12-14: qty 1

## 2023-12-14 MED ORDER — SODIUM CHLORIDE 0.9 % IV SOLN
3.0000 g | Freq: Four times a day (QID) | INTRAVENOUS | Status: DC
  Administered 2023-12-15 (×2): 3 g via INTRAVENOUS
  Filled 2023-12-14 (×3): qty 8

## 2023-12-14 NOTE — ED Notes (Signed)
Blood culture and lactic acid not collected during its initial order due time, therefore lab called this RN after it being due yesterday during day shift for it to be collected at this time. This RN unsure of why it was not collected during its initial due date and time. See orders for reference for initial order/time.

## 2023-12-14 NOTE — Progress Notes (Signed)
Progress Note   Patient: Tonya Hill GNF:[id number] DOB: [birth year] DOA: 12/13/2023     1 DOS: the patient was seen and examined on 12/14/2023   Brief hospital course:  RONNA VAUGHT is a 32 y.o. female with medical history significant of endometriosis, anxiety/depression, hyperlipidemia, who presents to the ED due to an abscess.   Mrs. Bidgood states that she began to have a little area of induration on the left side of her submandibular region with very small area of overlying redness that was barely noticeable.  Then over the last several days, she has noticed progressive worsening with swelling radiating down her neck.  She has continued to be able to drink but has struggled with chewing and so has had little to eat.  She denies any difficulty breathing, swallowing, nausea, vomiting, chest pain or palpitations.  She endorses a cough that is nonproductive.  Since starting Augmentin, she has not noticed any improvement and states swelling seems to have worsened.   Per chart review, patient presented to the ED on 12/16 with similar symptoms at which time CT imaging demonstrated otogenic cellulitis.  She was started on Augmentin and discharged home.  Today, patient had a visit with ENT at which time a an ultrasound of the neck was ordered.  Ultrasound completed yesterday with results demonstrating a 5.3 x 2.4 x 2.0 cm abscess in the submandibular region.  ENT recommended patient come to the ED for drainage and IV antibiotics.        Assessment and Plan:  * Submandibular abscess Patient presenting with worsening left-sided facial swelling and induration with evidence of a large abscess seen on ultrasound 2 days prior.   ENT recommending IR guided drainage. Patient is status post IR drainage, about 10cc of purulent material was aspirated - Continue Zosyn per pharmacy dosing - MRSA PCR is negative. Discontinue Vancomycin - S/p 1 L bolus   Hypotension Most likely medication induced Blood  pressure dropped after she received IV morphine for pain control Improved with IV fluid hydration    AKI Most likely prerenal secondary to poor oral intake Baseline serum creatinine is 1.04 on admission it was 1.48 Improved with IV fluid hydration Monitor closely during this hospitalization    ADD (attention deficit disorder) - Hold home Adderall while admitted   Anxiety and depression - Continue Lexapro and Bupropion   Morbid Obesity (BMI 42.97) Complicates overall prognosis and care Lifestyle modification and exercise has been discussed with patient in detail         Subjective: Seen and examined at the bedside. Mother is at the bedside.  Improved left-sided facial swelling.  No difficulty swallowing  Physical Exam: Vitals:   12/14/23 0345 12/14/23 0751 12/14/23 1100 12/14/23 1345  BP: 125/64 124/68  (!) 100/52  Pulse: 70 68 91 98  Resp: 18 18  16   Temp: 98.2 F (36.8 C) 98.1 F (36.7 C)  98.2 F (36.8 C)  TempSrc: Oral Oral  Oral  SpO2: 100% 100% 100% 98%  Weight:      Height:       Vitals and nursing note reviewed.  Constitutional:      General: She is not in acute distress.    Appearance: She is obese.  HENT:     Head: Normocephalic and atraumatic.  Eyes:     Conjunctiva/sclera: Conjunctivae normal.     Pupils: Pupils are equal, round, and reactive to light.  Neck:     Comments: Significant swelling, induration and erythema involving  the entire left submandibular region with radiation down into the neck.  Increased warmth to touch.  No radiation to the right side, stops at midline. Cardiovascular:     Rate and Rhythm: Normal rate and regular rhythm.     Heart sounds: No murmur heard.    No gallop.  Pulmonary:     Effort: Pulmonary effort is normal. No respiratory distress.     Breath sounds: Normal breath sounds. No wheezing, rhonchi or rales.  Skin:    General: Skin is warm and dry.  Neurological:     Mental Status: She is alert and oriented to  person, place, and time. Mental status is at baseline.  Psychiatric:        Mood and Affect: Mood normal.        Behavior: Behavior normal.     Data Reviewed: Labs reviewed.  White count 15.9, bicarb 20 There are no new results to review at this time.  Family Communication: Plan of care discussed with patient and her mother at the bedside.  All questions and concerns have been addressed.  Disposition: Status is: Inpatient Remains inpatient appropriate because: On IV antibiotics  Planned Discharge Destination: Home    Time spent: 33 minutes  Author: Lucile Shutters, MD 12/14/2023 3:08 PM  For on call review [email or web address].

## 2023-12-14 NOTE — Consult Note (Addendum)
NAME: Tonya Hill  DOB: 1991/09/25  MRN: [id number]  Date/Time: 12/14/2023 2:02 PM  REQUESTING PROVIDER: Dr. Joylene Igo Subjective:  REASON FOR CONSULT: mandibular abscess  ? Tonya Hill is a 32 y.o. female with a history of endometriosis presents with left sided jaw swelling of 1 week duration which is getting worse Pt has a cracked molar on the left lower side , but the tooth was not huriing her- She noted a swelling on the left side of her jaw line and came to ED on 12/15, a CT was done and it showed Periapical lucency of the left mandibular second molar with medial cortical breakthrough and extensive dental caries of the same tooth as well as the right maxillary second molar. She was place don augmentin and was asked to see ENT- she saw Dr.Vaught the next day and he ordered ultrasound. The US showed 5.3X 2 cm abscess and he asked her to get it drained by IR Meanwhile the swelling got worse and there was redness spreading on her neck  She was asked to go tot eh ED for admission and IV antibiotic Vitals in the ED on 12/19   12/13/23  BP 121/64  Temp 98.2 F (36.8 C)  Pulse Rate 68  Resp 18  SpO2 100 %    Latest Reference Range & Units 12/13/23  WBC 4.0 - 10.5 K/uL 12.7 (H)  Hemoglobin 12.0 - 15.0 g/dL 40.9  HCT 81.1 - 91.4 % 38.5  Platelets 150 - 400 K/uL 399  Creatinine 0.44 - 1.00 mg/dL 7.82 (H)   Blood culture sent- started on IV zosyn and vanco IR drained the abscess on 12/19 and sent for culture I am seeing the patient for management of the infection  Pt says she has had intermittent palpable swelling on the left side for 6 months She saw the dentist and waiting for root canal on rt upper molar Non smoker  Past Medical History:  Diagnosis Date   Anemia    Anxiety    Depression    Endometriosis    Ovarian cyst    Vitamin D deficiency 2020    Past Surgical History:  Procedure Laterality Date   INTRAUTERINE DEVICE INSERTION  12/26/2015   laproscopic abdominal  surgery     to remove endometriosis tissue    Social History   Socioeconomic History   Marital status: Married    Spouse name: Not on file   Number of children: Not on file   Years of education: Not on file   Highest education level: Not on file  Occupational History   Not on file  Tobacco Use   Smoking status: Never   Smokeless tobacco: Never  Vaping Use   Vaping status: Never Used  Substance and Sexual Activity   Alcohol use: Yes    Alcohol/week: 3.0 standard drinks of alcohol    Types: 3 Standard drinks or equivalent per week   Drug use: No   Sexual activity: Yes    Partners: Male    Birth control/protection: None  Other Topics Concern   Not on file  Social History Narrative   ** Merged History Encounter **       2/17: she graduated from college in 1/17 with a degree in psychology. She is looking for work and plans to go back to school and get her masters in psychology   Social Drivers of Health   Financial Resource Strain: Not on file  Food Insecurity: No Food Insecurity (01/06/2021)  Received from [facility], [facility]   Hunger Vital Sign    Worried About Running Out of Food in the Last Year: Never true    Ran Out of Food in the Last Year: Never true  Transportation Needs: Not on file  Physical Activity: Insufficiently Active (09/21/2020)   Received from [facility], [facility]   Exercise Vital Sign    Days of Exercise per Week: 2 days    Minutes of Exercise per Session: 30 min  Stress: No Stress Concern Present (09/21/2020)   Received from [facility], [facility] - Occupational Stress Questionnaire    Feeling of Stress : Not at all  Social Connections: Unknown (04/27/2022)   Received from [facility], [facility]   Social Network    Social Network: Not on file  Intimate Partner Violence: Unknown (03/31/2022)   Received from [facility], [facility]   HITS    Physically Hurt: Not on file     Insult or Talk Down To: Not on file    Threaten Physical Harm: Not on file    Scream or Curse: Not on file    Family History  Problem Relation Age of Onset   Hypertension Sister    Diabetes Paternal Grandfather    Hypertension Paternal Grandfather    Hypertension Father    Allergies  Allergen Reactions   Sulfa Antibiotics Itching and Shortness Of Breath   Doxycycline Other (See Comments)    hallucinations   Latex Rash   Omnicef [Cefdinir] Rash   Tape Rash   I? Current Facility-Administered Medications  Medication Dose Route Frequency Provider Last Rate Last Admin   acetaminophen (TYLENOL) tablet 650 mg  650 mg Oral Q6H PRN Verdene Lennert, MD       Or   acetaminophen (TYLENOL) suppository 650 mg  650 mg Rectal Q6H PRN Verdene Lennert, MD       buPROPion (WELLBUTRIN XL) 24 hr tablet 300 mg  300 mg Oral Daily Verdene Lennert, MD   300 mg at 12/14/23 0957   cholecalciferol (VITAMIN D3) 25 MCG (1000 UNIT) tablet 2,000 Units  2,000 Units Oral Daily Verdene Lennert, MD   2,000 Units at 12/14/23 0921   escitalopram (LEXAPRO) tablet 10 mg  10 mg Oral QHS Verdene Lennert, MD   10 mg at 12/13/23 1933   HYDROmorphone (DILAUDID) injection 0.5-1 mg  0.5-1 mg Intravenous Q3H PRN Verdene Lennert, MD   1 mg at 12/14/23 0216   ondansetron (ZOFRAN) tablet 4 mg  4 mg Oral Q6H PRN Verdene Lennert, MD       Or   ondansetron [facility]) injection 4 mg  4 mg Intravenous Q6H PRN Verdene Lennert, MD   4 mg at 12/14/23 0216   piperacillin-tazobactam (ZOSYN) IVPB 3.375 g  3.375 g Intravenous Q8H Meegan, Eryn, RPH 12.5 mL/hr at 12/14/23 1354 3.375 g at 12/14/23 1354   sodium chloride flush (NS) 0.9 % injection 3 mL  3 mL Intravenous Q12H Verdene Lennert, MD   3 mL at 12/14/23 1610   Current Outpatient Medications  Medication Sig Dispense Refill   amoxicillin-clavulanate (AUGMENTIN) 875-125 MG tablet Take 1 tablet by mouth 2 (two) times daily for 7 days. 14 tablet 0   amphetamine-dextroamphetamine (ADDERALL  XR) 20 MG 24 hr capsule Take by mouth.     amphetamine-dextroamphetamine (ADDERALL) 10 MG tablet Take 10 mg by mouth daily.     buPROPion (WELLBUTRIN XL) 150 MG 24 hr tablet Take 300 mg  by mouth.      Cholecalciferol (VITAMIN D) 50 MCG (2000 UT) CAPS Take 2,000 Units by mouth daily.     escitalopram (LEXAPRO) 10 MG tablet Take 1 tablet by mouth daily.     hyoscyamine (LEVSIN SL) 0.125 MG SL tablet Place 1 tablet (0.125 mg total) under the tongue every 4 (four) hours as needed for cramping (abdominal pain). 30 tablet 0   pantoprazole (PROTONIX) 40 MG tablet Take 1 tablet (40 mg total) by mouth daily. 30 tablet 3   JUNEL FE 1/20 1-20 MG-MCG tablet TAKE 1 TABLET BY MOUTH EVERY DAY 28 tablet 0   sertraline (ZOLOFT) 25 MG tablet Take 25 mg by mouth daily. (Patient not taking: Reported on 12/13/2023)     sucralfate (CARAFATE) 1 GM/10ML suspension Take 10 mLs (1 g total) by mouth 4 (four) times daily -  with meals and at bedtime. (Patient not taking: Reported on 12/13/2023) 420 mL 0     Abtx:  Anti-infectives (From admission, onward)    Start     Dose/Rate Route Frequency Ordered Stop   12/14/23 1700  vancomycin (VANCOREADY) IVPB 1250 mg/250 mL  Status:  Discontinued        1,250 mg 166.7 mL/hr over 90 Minutes Intravenous Every 24 hours 12/13/23 1423 12/14/23 0914   12/13/23 2000  piperacillin-tazobactam (ZOSYN) IVPB 3.375 g        3.375 g 12.5 mL/hr over 240 Minutes Intravenous Every 8 hours 12/13/23 1423     12/13/23 1630  vancomycin (VANCOCIN) IVPB 1000 mg/200 mL premix       Placed in "Followed by" Linked Group   1,000 mg 200 mL/hr over 60 Minutes Intravenous  Once 12/13/23 1423 12/13/23 1938   12/13/23 1500  vancomycin (VANCOCIN) IVPB 1000 mg/200 mL premix       Placed in "Followed by" Linked Group   1,000 mg 200 mL/hr over 60 Minutes Intravenous  Once 12/13/23 1423 12/13/23 1719   12/13/23 1200  piperacillin-tazobactam (ZOSYN) IVPB 3.375 g        3.375 g 12.5 mL/hr over 240 Minutes  Intravenous  Once 12/13/23 1121 12/13/23 1559       REVIEW OF SYSTEMS:  Const: negative fever, negative chills, negative weight loss Eyes: negative diplopia or visual changes, negative eye pain ENT: swelling of the neck and jaw line with some difficulty in swallowing Resp: negative cough, hemoptysis, dyspnea Cards: negative for chest pain, palpitations, lower extremity edema GU: negative for frequency, dysuria and hematuria GI: Negative for abdominal pain, diarrhea, bleeding, constipation Skin: negative for rash and pruritus Heme: negative for easy bruising and gum/nose bleeding MS: negative for myalgias, arthralgias, back pain and muscle weakness Neurolo:negative for headaches, dizziness, vertigo, memory problems  Psych: negative for feelings of anxiety, depression  Endocrine: negative for thyroid, diabetes Allergy/Immunology- negative for any medication or food allergies  Objective:  VITALS:  BP (!) 100/52 (BP Location: Right Arm)   Pulse 98   Temp 98.2 F (36.8 C) (Oral)   Resp 16   Ht 5' (1.524 m)   Wt 99.8 kg   LMP 11/13/2023   SpO2 98%   BMI 42.97 kg/m   PHYSICAL EXAM:  General: Alert, cooperative, no distress, appears stated age.  Head: Normocephalic, without obvious abnormality, atraumatic. Eyes: Conjunctivae clear, anicteric sclerae. Pupils are equal ENT Nares normal. No drainage or sinus tenderness. Teeth- cracked 2 nd molar left lower Swelling over the left side of the neck and jaw line Back: No CVA tenderness. Lungs: Clear  to auscultation bilaterally. No Wheezing or Rhonchi. No rales. Heart: Regular rate and rhythm, no murmur, rub or gallop. Abdomen: Soft, non-tender,not distended. Bowel sounds normal. No masses Extremities: atraumatic, no cyanosis. No edema. No clubbing Skin: No rashes or lesions. Or bruising Lymph: Cervical, supraclavicular normal. Neurologic: Grossly non-focal Pertinent Labs Lab Results CBC    Component Value Date/Time   WBC 15.9  (H) 12/14/2023 0351   RBC 4.09 12/14/2023 0351   HGB 11.7 (L) 12/14/2023 0351   HGB 12.7 10/15/2019 1338   HCT 36.0 12/14/2023 0351   HCT 39.5 10/15/2019 1338   PLT 411 (H) 12/14/2023 0351   PLT 420 10/15/2019 1338   MCV 88.0 12/14/2023 0351   MCV 89 10/15/2019 1338   MCV 91 01/11/2015 1155   MCH 28.6 12/14/2023 0351   MCHC 32.5 12/14/2023 0351   RDW 12.4 12/14/2023 0351   RDW 12.0 10/15/2019 1338   RDW 13.9 01/11/2015 1155   LYMPHSABS 1.0 12/14/2023 0351   LYMPHSABS 1.4 04/30/2014 1011   MONOABS 0.4 12/14/2023 0351   MONOABS 0.5 04/30/2014 1011   EOSABS 0.0 12/14/2023 0351   EOSABS 0.0 04/30/2014 1011   BASOSABS 0.0 12/14/2023 0351   BASOSABS 0.0 04/30/2014 1011       Latest Ref Rng & Units 12/14/2023    3:51 AM 12/13/2023   11:45 AM 04/03/2023    8:45 PM  CMP  Glucose 70 - 99 mg/dL 161  91  97   BUN 6 - 20 mg/dL 13  12  11    Creatinine 0.44 - 1.00 mg/dL 0.96  0.45  4.09   Sodium 135 - 145 mmol/L 135  137  136   Potassium 3.5 - 5.1 mmol/L 4.5  3.8  3.4   Chloride 98 - 111 mmol/L 105  104  104   CO2 22 - 32 mmol/L 20  21  25    Calcium 8.9 - 10.3 mg/dL 8.7  8.8  8.7       Microbiology: Recent Results (from the past 240 hours)  Blood culture (routine x 2)     Status: None (Preliminary result)   Collection Time: 12/13/23 11:45 AM   Specimen: BLOOD LEFT ARM  Result Value Ref Range Status   Specimen Description BLOOD LEFT ARM  Final   Special Requests   Final    BOTTLES DRAWN AEROBIC AND ANAEROBIC Blood Culture results may not be optimal due to an inadequate volume of blood received in culture bottles   Culture   Final    NO GROWTH < 24 HOURS Performed at [facility], [address] [place], [place] [address]    Report Status PENDING  Incomplete  Aerobic/Anaerobic Culture w Gram Stain (surgical/deep wound)     Status: None (Preliminary result)   Collection Time: 12/13/23  3:35 PM   Specimen: Neck; Abscess  Result Value Ref Range Status   Specimen  Description   Final    NECK ABSCESS Performed at [facility], [address] [place], [place] [address]    Special Requests   Final    NONE Performed at [facility], [address] [place], [place] [address]    Gram Stain PENDING  Incomplete   Culture   Final    NO GROWTH < 12 HOURS Performed at [facility], [address], [place], [place] [address]    Report Status PENDING  Incomplete  MRSA Next Gen by PCR, Nasal     Status: None  Collection Time: 12/14/23  3:51 AM   Specimen: Nasal Mucosa; Nasal Swab  Result Value Ref Range Status   MRSA by PCR Next Gen NOT DETECTED NOT DETECTED Final    Comment: (NOTE) The GeneXpert MRSA Assay (FDA approved for NASAL specimens only), is one component of a comprehensive MRSA colonization surveillance program. It is not intended to diagnose MRSA infection nor to guide or monitor treatment for MRSA infections. Test performance is not FDA approved in patients less than 58 years old. Performed at [facility], [address] [place], [place] [address]     IMAGING RESULTS: CT scan of mandible  Left mandibular second molar with medial cortical breakthru and periapical lucency I have personally reviewed the films ? Impression/Recommendation ?Odontogenic infection of the mandible Odontogenic abscess Cellulitis Osteomyelitis likely due to medial cortical breakthru at the level of 2nd molar Pt is currently on zosyn and vanco Abscess was aspirated and sent for culture Usual organisms are anerobes, actinomyces and streptococcus from oral cavity Less likely to be Staph or pseudomonas Will dc vanco Would change zosyn to unasyn tomorrow If ready for discharge can go on high dose augmentin ER 2grams BID for 4 weeks Will have to make sure pharmcay has this strength- if not she could do Augmentin 875 Po TID Will follow culture result  She needs to see OMFS I will follow her as OP ( 01/08/24)   ________________________________________________ Discussed with patient, and her husband

## 2023-12-14 NOTE — ED Notes (Signed)
This tech attempted to get blood from pt but unable to obtain due to pt being a hard stick. Will inform RN.

## 2023-12-14 NOTE — Progress Notes (Signed)
Continues to have improvement.  No need for pain medications for last 12 hours.  No growth on cultures.  No growth on culture from I&D.  Improved induration but continued pain.  Elevate white count most likely steroid induced as got 10mg  x1 yesterday.  Will repeat 10mg  dose today.  Possible discharge home on oral antibiotics and oral steroids tomorrow.  Infectious disease was consulted by hospitalist.  Defer antibiotic to Infectious Disease and Hospitalist.

## 2023-12-14 NOTE — Progress Notes (Signed)
..12/14/2023 11:51 AM  Tonya Hill [id number]    Temp:  [98.1 F (36.7 C)-98.7 F (37.1 C)] 98.1 F (36.7 C) (12/20 0751) Pulse Rate:  [68-94] 91 (12/20 1100) Resp:  [16-18] 18 (12/20 0751) BP: (89-125)/(50-69) 124/68 (12/20 0751) SpO2:  [100 %] 100 % (12/20 1100),     Intake/Output Summary (Last 24 hours) at 12/14/2023 1151 Last data filed at 12/14/2023 5956 Gross per 24 hour  Intake 148.96 ml  Output --  Net 148.96 ml    Results for orders placed or performed during the hospital encounter of 12/13/23 (from the past 24 hours)  Aerobic/Anaerobic Culture w Gram Stain (surgical/deep wound)     Status: None (Preliminary result)   Collection Time: 12/13/23  3:35 PM   Specimen: Neck; Abscess  Result Value Ref Range   Specimen Description      NECK ABSCESS Performed at [facility], [address], [place], [place] [address]    Special Requests      NONE Performed at [facility], [address] [place], [place] [address]    Gram Stain PENDING    Culture      NO GROWTH < 12 HOURS Performed at [facility], [address] [place], [place] [address]    Report Status PENDING   MRSA Next Gen by PCR, Nasal     Status: None   Collection Time: 12/14/23  3:51 AM   Specimen: Nasal Mucosa; Nasal Swab  Result Value Ref Range   MRSA by PCR Next Gen NOT DETECTED NOT DETECTED  CBC with Differential/Platelet     Status: Abnormal   Collection Time: 12/14/23  3:51 AM  Result Value Ref Range   WBC 15.9 (H) 4.0 - 10.5 K/uL   RBC 4.09 3.87 - 5.11 MIL/uL   Hemoglobin 11.7 (L) 12.0 - 15.0 g/dL   HCT 16.6 06.3 - 01.6 %   MCV 88.0 80.0 - 100.0 fL   MCH 28.6 26.0 - 34.0 pg   MCHC 32.5 30.0 - 36.0 g/dL   RDW 01.0 93.2 - 35.5 %   Platelets 411 (H) 150 - 400 K/uL   nRBC 0.0 0.0 - 0.2 %   Neutrophils Relative % 90 %   Neutro Abs 14.3 (H) 1.7 - 7.7 K/uL   Lymphocytes Relative 7 %   Lymphs Abs 1.0 0.7 - 4.0 K/uL   Monocytes Relative 3 %   Monocytes Absolute 0.4  0.1 - 1.0 K/uL   Eosinophils Relative 0 %   Eosinophils Absolute 0.0 0.0 - 0.5 K/uL   Basophils Relative 0 %   Basophils Absolute 0.0 0.0 - 0.1 K/uL   Immature Granulocytes 0 %   Abs Immature Granulocytes 0.07 0.00 - 0.07 K/uL  Basic metabolic panel     Status: Abnormal   Collection Time: 12/14/23  3:51 AM  Result Value Ref Range   Sodium 135 135 - 145 mmol/L   Potassium 4.5 3.5 - 5.1 mmol/L   Chloride 105 98 - 111 mmol/L   CO2 20 (L) 22 - 32 mmol/L   Glucose, Bld 130 (H) 70 - 99 mg/dL   BUN 13 6 - 20 mg/dL   Creatinine, Ser 7.32 0.44 - 1.00 mg/dL   Calcium 8.7 (L) 8.9 - 10.3 mg/dL   GFR, Estimated >20 >25 mL/min   Anion gap 10 5 - 15    SUBJECTIVE:  No acute events overnight.  IR drained 10ml from abscess yesterday.  Spot opened up in mouth with drainage  as well.  Patient reports improved symptoms and swelling.  Tolerating soft diet.  OBJECTIVE:  GEN-  NAD, sitting up in bed OC/OP-  fractured molar with area of opening and drainage along lateral mandible just beneath molar.  Improvement in widening of mandible.   NECK-  continues to have large area of firmness along body of left mandible/submandibular space.  Improved area of induration surrounding this with more of a defined area of infection  IMPRESSION:  Odontogenic abscess s/p IR drainage with autodraining in oral cavity  PLAN:  Improvement.  Continue IV abx.  Anticipate d/c either later this afternoon or possibly in morning if continues current trajectory.  Adjust abx based on culture results.  Tonya Hill 12/14/2023, 11:51 AM

## 2023-12-15 ENCOUNTER — Encounter: Payer: Self-pay | Admitting: Internal Medicine

## 2023-12-15 DIAGNOSIS — M869 Osteomyelitis, unspecified: Secondary | ICD-10-CM | POA: Insufficient documentation

## 2023-12-15 DIAGNOSIS — N179 Acute kidney failure, unspecified: Secondary | ICD-10-CM | POA: Diagnosis present

## 2023-12-15 DIAGNOSIS — M272 Inflammatory conditions of jaws: Secondary | ICD-10-CM | POA: Diagnosis present

## 2023-12-15 DIAGNOSIS — K122 Cellulitis and abscess of mouth: Secondary | ICD-10-CM | POA: Diagnosis not present

## 2023-12-15 LAB — HIV ANTIBODY (ROUTINE TESTING W REFLEX): HIV Screen 4th Generation wRfx: NONREACTIVE

## 2023-12-15 MED ORDER — AMOXICILLIN-POT CLAVULANATE 875-125 MG PO TABS: 1.0000 | ORAL_TABLET | Freq: Three times a day (TID) | ORAL | 0 refills | Status: DC

## 2023-12-15 MED ORDER — AMOXICILLIN-POT CLAVULANATE 875-125 MG PO TABS: 1.0000 | ORAL_TABLET | Freq: Three times a day (TID) | ORAL | 0 refills | Status: AC

## 2023-12-15 MED ORDER — PREDNISONE 10 MG PO TABS: ORAL_TABLET | ORAL | 0 refills | Status: AC

## 2023-12-15 MED ORDER — PREDNISONE 10 MG PO TABS: ORAL_TABLET | ORAL | 0 refills | Status: DC

## 2023-12-15 MED ORDER — DEXAMETHASONE SODIUM PHOSPHATE 10 MG/ML IJ SOLN
6.0000 mg | Freq: Once | INTRAMUSCULAR | Status: AC
  Administered 2023-12-15: 6 mg via INTRAVENOUS
  Filled 2023-12-15: qty 1

## 2023-12-15 NOTE — Progress Notes (Signed)
..  12/15/2023 9:38 AM  Anastasia Fiedler [id number]    Temp:  [97.8 F (36.6 C)-98.6 F (37 C)] 98.1 F (36.7 C) (12/21 0830) Pulse Rate:  [61-98] 72 (12/21 0830) Resp:  [16-20] 16 (12/21 0830) BP: (90-127)/(47-72) 90/50 (12/21 0830) SpO2:  [98 %-100 %] 100 % (12/21 0830) Weight:  [102.1 kg] 102.1 kg (12/20 2023),    No intake or output data in the 24 hours ending 12/15/23 4098  Results for orders placed or performed during the hospital encounter of 12/13/23 (from the past 24 hours)  HIV Antibody (routine testing w rflx)     Status: None   Collection Time: 12/15/23  3:32 AM  Result Value Ref Range   HIV Screen 4th Generation wRfx Non Reactive Non Reactive    SUBJECTIVE:  No acute events.  Continues to improve.  Still some tightness and tenderness.  Tolerating PO.  OBJECTIVE:  GEN-  NAD supine in bed OC/OP-  defect inferior to left posterior molar.  Improved widening of posterior mandible NECK-  improved induration but continued large firm tender area.  More centered in submental/body of mandible than general neck.  More mobile.  Less overyling erythema.  No fluctuance  IMPRESSION:  Odontogenic abscess  PLAN:  Continues to slowly improve.  ID switched antibiotic to Unasyn.  Will do another decadron 6mg  x 1.  Defer to ID regarding discharge antibiotics.  Recommend 6 day steroid taper in addition upon discharge.  Given slow improvement, recommend continue IV antibiotics at this time.  Will see tomorrow to determine if ok for discharge.  Jathniel Smeltzer 12/15/2023, 9:38 AM

## 2023-12-15 NOTE — Discharge Summary (Signed)
Physician Discharge Summary   Tonya Hill: Tonya Hill MRN: [id number] DOB: 20-Aug-1991  Admit date:     12/13/2023  Discharge date: 12/15/23  Discharge Physician: Dejae Bernet   PCP: Jerl Mina, MD   Recommendations at discharge:      Discharge Diagnoses: Principal Problem:   Submandibular abscess Active Problems:   Osteomyelitis of jaw   Anxiety and depression   ADD (attention deficit disorder)   AKI (acute kidney injury) (HCC)   Obesity, Class III, BMI 40-49.9 (morbid obesity) (HCC)  Resolved Problems:   * No resolved hospital problems. *  Hospital Course:  Tonya Hill is a 32 y.o. female with medical history significant of endometriosis, anxiety/depression, hyperlipidemia, who presents to the ED due to an abscess.   Tonya Hill states that Tonya Hill began to have a little area of induration on the left side of her submandibular region with very small area of overlying redness that was barely noticeable.  Then over the last several days, Tonya Hill has noticed progressive worsening with swelling radiating down her neck.  Tonya Hill has continued to be able to drink but has struggled with chewing and so has had little to eat.  Tonya Hill denies any difficulty breathing, swallowing, nausea, vomiting, chest pain or palpitations.  Tonya Hill endorses a cough that is nonproductive.  Since starting Augmentin, Tonya Hill has not noticed any improvement and states swelling seems to have worsened.   Per chart review, Tonya Hill presented to the ED on 12/16 with similar symptoms at which time CT imaging demonstrated otogenic cellulitis.  Tonya Hill was started on Augmentin and discharged home.  Today, Tonya Hill had a visit with ENT at which time a an ultrasound of the neck was ordered.  Ultrasound completed yesterday with results demonstrating a 5.3 x 2.4 x 2.0 cm abscess in the submandibular region.  ENT recommended Tonya Hill come to the ED for drainage and IV antibiotics.   ED course: On arrival to the ED, Tonya Hill was normotensive at  135/84 with heart rate of 84.  Tonya Hill was saturating at 100% on room air.  Tonya Hill was afebrile at 98.3.  Initial workup notable for WBC of 12.7, bicarb 21, creat 1.13 with GFR above 60.  Tonya Hill started on IV fluids and Zosyn.  TRH contacted for admission.   Review of Systems: As mentioned in the history of present illness. All other systems reviewed and are negative.        Assessment and Plan:   * Submandibular abscess Tonya Hill presented with worsening left-sided facial swelling and induration with evidence of a large abscess seen on ultrasound 2 days prior.   ENT recommended IR guided drainage. Tonya Hill is status post IR drainage, about 10cc of purulent material was aspirated.  Culture pending Tonya Hill was on IV Zosyn per pharmacy dosing was switched to Unasyn by ID Appreciate infectious disease input and recommends high-dose Augmentin on discharge for 4 weeks due to concerns for osteomyelitis likely due to medial cortical breakthrough at the level of second molar Tonya Hill will be discharged home on Augmentin 8 7 5  mg 3 times daily for 4 weeks and a steroid taper by ENT.    Hypotension Most likely medication induced Blood pressure dropped after Tonya Hill received IV morphine for pain control Improved with IV fluid hydration     AKI Most likely prerenal secondary to poor oral intake Baseline serum creatinine is 1.04 on admission it was 1.48 Improved with IV fluid hydration Monitor closely during this hospitalization      ADD (attention deficit disorder) -  Continue Adderall    Anxiety and depression - Continue Lexapro and Bupropion     Morbid Obesity (BMI 42.97) Complicates overall prognosis and care Lifestyle modification and exercise has been discussed with Tonya Hill in detail           Consultants: Infectious disease, ENT, interventional radiology Procedures performed: Ultrasound-guided aspiration of left submental/submandibular abscess Disposition: Home Diet  recommendation:  Discharge Diet Orders (From admission, onward)     Start     Ordered   12/15/23 0000  Diet - low sodium heart healthy        12/15/23 1153   12/15/23 0000  Diet general        12/15/23 1153           Regular diet DISCHARGE MEDICATION: Allergies as of 12/15/2023       Reactions   Sulfa Antibiotics Itching, Shortness Of Breath   Doxycycline Other (See Comments)   hallucinations   Latex Rash   Omnicef [cefdinir] Rash   Tape Rash        Medication List     STOP taking these medications    sertraline 25 MG tablet Commonly known as: ZOLOFT       TAKE these medications    amoxicillin-clavulanate 875-125 MG tablet Commonly known as: AUGMENTIN Take 1 tablet by mouth 3 (three) times daily for 28 days. What changed: when to take this   amphetamine-dextroamphetamine 20 MG 24 hr capsule Commonly known as: ADDERALL XR Take by mouth. What changed: Another medication with the same name was removed. Continue taking this medication, and follow the directions you see here.   buPROPion 150 MG 24 hr tablet Commonly known as: WELLBUTRIN XL Take 300 mg by mouth.   escitalopram 10 MG tablet Commonly known as: LEXAPRO Take 1 tablet by mouth daily.   hyoscyamine 0.125 MG SL tablet Commonly known as: LEVSIN SL Place 1 tablet (0.125 mg total) under the tongue every 4 (four) hours as needed for cramping (abdominal pain).   Junel FE 1/20 1-20 MG-MCG tablet Generic drug: norethindrone-ethinyl estradiol-FE TAKE 1 TABLET BY MOUTH EVERY DAY   pantoprazole 40 MG tablet Commonly known as: PROTONIX Take 1 tablet (40 mg total) by mouth daily.   predniSONE 10 MG tablet Commonly known as: DELTASONE Take 3 tablets (30 mg total) by mouth daily for 1 day, THEN 2.5 tablets (25 mg total) daily for 1 day, THEN 2 tablets (20 mg total) daily for 1 day, THEN 1.5 tablets (15 mg total) daily for 1 day, THEN 1 tablet (10 mg total) daily for 1 day, THEN 0.5 tablets (5 mg total)  daily for 1 day. Start taking on: December 15, 2023   sucralfate 1 GM/10ML suspension Commonly known as: Carafate Take 10 mLs (1 g total) by mouth 4 (four) times daily -  with meals and at bedtime.   Vitamin D 50 MCG (2000 UT) Caps Take 2,000 Units by mouth daily.        Follow-up Information     Lynn Ito, MD Follow up on 01/08/2024.   Specialty: Infectious Diseases Contact information: [address] [place] [address] [phone]         Bud Face, MD Follow up on 12/24/2023.   Specialty: Otolaryngology Contact information: [address] [place] [address] [phone]                Discharge Exam: Ceasar Mons Weights   12/13/23 1059 12/14/23 2023  Weight: 99.8 kg 102.1 kg  Constitutional:      General: Tonya Hill is not in acute distress.    Appearance: Tonya Hill is obese.  HENT:     Head: Normocephalic and atraumatic.  Eyes:     Conjunctiva/sclera: Conjunctivae normal.     Pupils: Pupils are equal, round, and reactive to light.  Neck:     Comments: Improved left submandibular swelling, redness, induration and differential warmth Cardiovascular:     Rate and Rhythm: Normal rate and regular rhythm.     Heart sounds: No murmur heard.    No gallop.  Pulmonary:     Effort: Pulmonary effort is normal. No respiratory distress.     Breath sounds: Normal breath sounds. No wheezing, rhonchi or rales.  Skin:    General: Skin is warm and dry.  Neurological:     Mental Status: Tonya Hill is alert and oriented to person, place, and time. Mental status is at baseline.  Psychiatric:        Mood and Affect: Mood normal.        Behavior: Behavior normal.     Condition at discharge: stable  The results of significant diagnostics from this hospitalization (including imaging, microbiology, ancillary and laboratory) are listed below for reference.   Imaging Studies: [place] FINE NEEDLE ASP 1ST LESION Result Date:  12/13/2023 INDICATION: 32 year old female with odontogenic abscess EXAM: ULTRASOUND-GUIDED ASPIRATION OF NECK ABSCESS MEDICATIONS: None ANESTHESIA/SEDATION: None COMPLICATIONS: None PROCEDURE: Informed written consent was obtained from the Tonya Hill after a thorough discussion of the procedural risks, benefits and alternatives. All questions were addressed. Maximal Sterile Barrier Technique was utilized including caps, mask, sterile gowns, sterile gloves, sterile drape, hand hygiene and skin antiseptic. A timeout was performed prior to the initiation of the procedure. Tonya Hill positioned supine on the ultrasound stretcher. Ultrasound images were stored and sent to PACs. The Tonya Hill was prepped and draped in the usual sterile fashion. 1% lidocaine was used for local anesthesia. 1% lidocaine was used for local anesthesia. Upon infiltration of the site with lidocaine, the Tonya Hill complained of some leakage of foul material into her oral cavity. This was expectorated, and then approximally 10 cc of purulent fluid was aspirated with an 18 gauge needle from the sub mental/submandibular region abscess. Needle was removed and a final image was stored. Sterile bandage was placed. Tonya Hill tolerated the procedure well and remained hemodynamically stable throughout. No complications were encountered and no significant blood loss. FINDINGS: Approximally 10 cc of purulent material aspirated IMPRESSION: Status post ultrasound-guided aspiration of left submental/submandibular abscess. Signed, Yvone Neu. Miachel Roux, RPVI [facility] [employer] [facility] Electronically Signed   By: Gilmer Mor D.O.   On: 12/13/2023 16:03   US SOFT TISSUE HEAD & NECK (NON-THYROID) Result Date: 12/11/2023 CLINICAL DATA:  Left-sided neck mass the past 6 months, increased in size for the past 4 days. EXAM: ULTRASOUND OF HEAD/NECK SOFT TISSUES TECHNIQUE: Ultrasound examination of the head and neck soft tissues  was performed in the area of clinical concern. COMPARISON:  Maxillofacial CT-12/09/2023 FINDINGS: Sonographic evaluation of Tonya Hill's area of discomfort involving the submandibular region of the left side of the neck correlates with a ill-defined mixed echogenic apparent fluid collection/abscess measuring approximately 5.3 x 2.4 x 2.0 cm (images 12 and 24). Note is made of a mildly prominent though non pathologically enlarged and benign-appearing adjacent submandibular chain lymph node which is not enlarged by size criteria measuring 0.8 cm in greatest short axis diameter and maintains a benign fatty hilum (image 6). IMPRESSION: Sonographic evaluation  of Tonya Hill's area of discomfort involving the submandibular region of the left side of the neck correlates with a ill-defined mixed echogenic apparent fluid collection/abscess measuring approximately 5.3 x 2.4 x 2.0 cm, potentially progressed compared to noncontrast maxillofacial CT performed 12/09/2023. Further evaluation with contrast-enhanced neck CT could be performed as indicated. These results will be called to the ordering clinician or representative by the Radiologist Assistant, and communication documented in the PACS or [employer]. Electronically Signed   By: Simonne Come M.D.   On: 12/11/2023 14:38   CT Maxillofacial Wo Contrast Result Date: 12/09/2023 CLINICAL DATA:  Facial swelling; Facial trauma, blunt. EXAM: CT HEAD WITHOUT CONTRAST CT MAXILLOFACIAL WITHOUT CONTRAST TECHNIQUE: Multidetector CT imaging of the head and maxillofacial structures were performed using the standard protocol without intravenous contrast. Multiplanar CT image reconstructions of the maxillofacial structures were also generated. RADIATION DOSE REDUCTION: This exam was performed according to the departmental dose-optimization program which includes automated exposure control, adjustment of the mA and/or kV according to Tonya Hill size and/or use of iterative reconstruction  technique. COMPARISON:  None Available. FINDINGS: CT HEAD FINDINGS Brain: No acute intracranial hemorrhage. Gray-white differentiation is preserved. No hydrocephalus or extra-axial collection. No mass effect or midline shift. Vascular: No hyperdense vessel or unexpected calcification. Skull: No calvarial fracture or suspicious bone lesion. Skull base is unremarkable. Other: None. CT MAXILLOFACIAL FINDINGS Osseous: Periapical lucency of the left mandibular second molar with medial cortical breakthrough. Extensive dental caries of the same tooth as well as the right maxillary second molar. Orbits: Negative. No traumatic or inflammatory finding. Sinuses: Well aerated. Soft tissues: Enlarged left submandibular lymph nodes with adjacent thickening of the platysma and extensive surrounding inflammatory fat stranding, consistent with odontogenic cellulitis. IMPRESSION: 1. No acute intracranial abnormality. 2. Periapical lucency of the left mandibular second molar with medial cortical breakthrough and extensive dental caries of the same tooth as well as the right maxillary second molar. 3. Enlarged left submandibular lymph nodes with adjacent thickening of the platysma and extensive surrounding inflammatory fat stranding, consistent with odontogenic cellulitis. Electronically Signed   By: Orvan Falconer M.D.   On: 12/09/2023 21:21   CT HEAD WO CONTRAST ( ) Result Date: 12/09/2023 CLINICAL DATA:  Facial swelling; Facial trauma, blunt. EXAM: CT HEAD WITHOUT CONTRAST CT MAXILLOFACIAL WITHOUT CONTRAST TECHNIQUE: Multidetector CT imaging of the head and maxillofacial structures were performed using the standard protocol without intravenous contrast. Multiplanar CT image reconstructions of the maxillofacial structures were also generated. RADIATION DOSE REDUCTION: This exam was performed according to the departmental dose-optimization program which includes automated exposure control, adjustment of the mA and/or kV  according to Tonya Hill size and/or use of iterative reconstruction technique. COMPARISON:  None Available. FINDINGS: CT HEAD FINDINGS Brain: No acute intracranial hemorrhage. Gray-white differentiation is preserved. No hydrocephalus or extra-axial collection. No mass effect or midline shift. Vascular: No hyperdense vessel or unexpected calcification. Skull: No calvarial fracture or suspicious bone lesion. Skull base is unremarkable. Other: None. CT MAXILLOFACIAL FINDINGS Osseous: Periapical lucency of the left mandibular second molar with medial cortical breakthrough. Extensive dental caries of the same tooth as well as the right maxillary second molar. Orbits: Negative. No traumatic or inflammatory finding. Sinuses: Well aerated. Soft tissues: Enlarged left submandibular lymph nodes with adjacent thickening of the platysma and extensive surrounding inflammatory fat stranding, consistent with odontogenic cellulitis. IMPRESSION: 1. No acute intracranial abnormality. 2. Periapical lucency of the left mandibular second molar with medial cortical breakthrough and extensive dental caries of the same tooth as well  as the right maxillary second molar. 3. Enlarged left submandibular lymph nodes with adjacent thickening of the platysma and extensive surrounding inflammatory fat stranding, consistent with odontogenic cellulitis. Electronically Signed   By: Orvan Falconer M.D.   On: 12/09/2023 21:21    Microbiology: Results for orders placed or performed during the hospital encounter of 12/13/23  Blood culture (routine x 2)     Status: None (Preliminary result)   Collection Time: 12/13/23 11:45 AM   Specimen: BLOOD LEFT ARM  Result Value Ref Range Status   Specimen Description BLOOD LEFT ARM  Final   Special Requests   Final    BOTTLES DRAWN AEROBIC AND ANAEROBIC Blood Culture results may not be optimal due to an inadequate volume of blood received in culture bottles   Culture   Final    NO GROWTH 2 DAYS Performed  at [facility], [address] [place], [place] [address]    Report Status PENDING  Incomplete  Aerobic/Anaerobic Culture w Gram Stain (surgical/deep wound)     Status: None (Preliminary result)   Collection Time: 12/13/23  3:35 PM   Specimen: Neck; Abscess  Result Value Ref Range Status   Specimen Description   Final    NECK ABSCESS Performed at [facility], [address], [place], [place] [address]    Special Requests   Final    NONE Performed at [facility] [address] [place], [place] [address]    Gram Stain PENDING  Incomplete   Culture   Final    CULTURE REINCUBATED FOR BETTER GROWTH Performed at [facility], [address], [place], [place] [address]    Report Status PENDING  Incomplete  MRSA Next Gen by PCR, Nasal     Status: None   Collection Time: 12/14/23  3:51 AM   Specimen: Nasal Mucosa; Nasal Swab  Result Value Ref Range Status   MRSA by PCR Next Gen NOT DETECTED NOT DETECTED Final    Comment: (NOTE) The GeneXpert MRSA Assay (FDA approved for NASAL specimens only), is one component of a comprehensive MRSA colonization surveillance program. It is not intended to diagnose MRSA infection nor to guide or monitor treatment for MRSA infections. Test performance is not FDA approved in patients less than 34 years old. Performed at [facility], [address] [place], [place] [address]   Blood culture (routine x 2)     Status: None (Preliminary result)   Collection Time: 12/14/23  3:51 AM   Specimen: BLOOD  Result Value Ref Range Status   Specimen Description BLOOD BLOOD RIGHT ARM  Final   Special Requests   Final    BOTTLES DRAWN AEROBIC AND ANAEROBIC Blood Culture adequate volume   Culture   Final    NO GROWTH 1 DAY Performed at [facility], [address], [place], [place] [address]    Report Status PENDING  Incomplete    Labs: CBC: Recent Labs  Lab 12/13/23 1145 12/14/23 0351  WBC 12.7*  15.9*  NEUTROABS 10.0* 14.3*  HGB 12.2 11.7*  HCT 38.5 36.0  MCV 88.9 88.0  PLT 399 411*   Basic Metabolic Panel: Recent Labs  Lab 12/13/23 1145 12/14/23 0351  NA 137 135  K 3.8 4.5  CL 104 105  CO2 21* 20*  GLUCOSE 91 130*  BUN 12 13  CREATININE 1.13* 0.93  CALCIUM 8.8* 8.7*   Liver Function Tests: Recent Labs  Lab 12/14/23 0351  AST 20  ALT 15  ALKPHOS 76  BILITOT 0.9  PROT 7.3  ALBUMIN 3.3*   CBG: No results for input(s): "GLUCAP" in the last 168 hours.  Discharge time spent: greater than 30 minutes.  Signed: Lucile Shutters, MD [facility] 12/15/2023

## 2023-12-15 NOTE — Progress Notes (Signed)
Abscess culture streptococcus constellatus- usually sensitive to PCN-  she has been discharged on Augmenitn

## 2023-12-18 LAB — CULTURE, BLOOD (ROUTINE X 2): Culture: NO GROWTH

## 2023-12-18 LAB — AEROBIC/ANAEROBIC CULTURE W GRAM STAIN (SURGICAL/DEEP WOUND): Culture: NO GROWTH

## 2023-12-19 LAB — CULTURE, BLOOD (ROUTINE X 2)
Culture: NO GROWTH
Special Requests: ADEQUATE

## 2024-01-08 ENCOUNTER — Inpatient Hospital Stay: Payer: Managed Care, Other (non HMO) | Admitting: Infectious Diseases
